# Patient Record
Sex: Male | Born: 1955 | Race: White | Hispanic: No | Marital: Married | Smoking: Former smoker
Health system: Southern US, Community
[De-identification: ages and names within clinical notes are randomized; demographics above are authoritative.]

## PROBLEM LIST (undated history)

## (undated) DIAGNOSIS — M1990 Unspecified osteoarthritis, unspecified site: Secondary | ICD-10-CM

## (undated) DIAGNOSIS — E669 Obesity, unspecified: Secondary | ICD-10-CM

## (undated) DIAGNOSIS — J45909 Unspecified asthma, uncomplicated: Secondary | ICD-10-CM

## (undated) DIAGNOSIS — K219 Gastro-esophageal reflux disease without esophagitis: Secondary | ICD-10-CM

## (undated) DIAGNOSIS — R7303 Prediabetes: Secondary | ICD-10-CM

## (undated) DIAGNOSIS — M109 Gout, unspecified: Secondary | ICD-10-CM

## (undated) DIAGNOSIS — I700 Atherosclerosis of aorta: Secondary | ICD-10-CM

## (undated) DIAGNOSIS — R0600 Dyspnea, unspecified: Secondary | ICD-10-CM

## (undated) DIAGNOSIS — R202 Paresthesia of skin: Secondary | ICD-10-CM

## (undated) HISTORY — DX: Atherosclerosis of aorta: I70.0

## (undated) HISTORY — DX: Gout, unspecified: M10.9

## (undated) HISTORY — DX: Prediabetes: R73.03

## (undated) HISTORY — DX: Unspecified osteoarthritis, unspecified site: M19.90

## (undated) HISTORY — DX: Obesity, unspecified: E66.9

## (undated) HISTORY — DX: Paresthesia of skin: R20.2

## (undated) HISTORY — PX: OTHER SURGICAL HISTORY: SHX169

---

## 2017-02-20 NOTE — H&P (Signed)
Alphonsa OverallBrad Brookelyn Gaynor PA-C, MPAS [facility] now [facility]  [place] [address], [place], [place] [address] Phone: [phone] [email or web address] Facebook  Instagram  LinkedIn  Twitter     Travis SmokerRandy Simmons is an 62 y.o. male.    Chief Complaint: left shoulder pain  HPI: Pt is a 62 y.o. male complaining of left shoulder pain for multiple years. Pain had continually increased since the beginning. X-rays in the clinic show end-stage arthritic changes of the left shoulder. Pt has tried various conservative treatments which have failed to alleviate their symptoms, including injections and therapy. Various options are discussed with the patient. Risks, benefits and expectations were discussed with the patient. Patient understand the risks, benefits and expectations and wishes to proceed with surgery.   PCP:  No primary care provider on file.  D/C Plans: Home  PMH: No past medical history on file.   Social History:  has no tobacco, alcohol, and drug history on file.  Allergies:  Allergies  Allergen Reactions  . Naproxen Rash    Medications: No current facility-administered medications for this encounter.    Current Outpatient Medications  Medication Sig Dispense Refill  . albuterol (PROVENTIL HFA;VENTOLIN HFA) 108 (90 Base) MCG/ACT inhaler Inhale 2 puffs into the lungs every 4 (four) hours as needed for wheezing or shortness of breath.    . budesonide-formoterol (SYMBICORT) 160-4.5 MCG/ACT inhaler Inhale 2 puffs into the lungs 2 (two) times daily.    Marland Kitchen. ibuprofen (ADVIL,MOTRIN) 200 MG tablet Take 600 mg by mouth 2 (two) times daily as needed for headache or mild pain.      No results found for this or any previous visit (from the past 48 hour(s)). No results found.  ROS: Pain with rom of the left upper extremity  Physical Exam:  Alert and oriented 62 y.o. male in no acute distress Cranial nerves 2-12 intact Cervical spine: full rom with no  tenderness, nv intact distally Chest: active breath sounds bilaterally, no wheeze rhonchi or rales Heart: regular rate and rhythm, no murmur Abd: non tender non distended with active bowel sounds Hip is stable with rom  Left shoulder: limited rom due to crepitus Strength of ER and IR 4.5/5  No rashes or edema  Assessment/Plan Assessment: left shoulder end stage osteoarthritis  Plan: Patient will undergo a left total shoulder arthro[facility]. Risks benefits and expectations were discussed with the patient. Patient understand risks, benefits and expectations and wishes to proceed.

## 2017-02-25 NOTE — Pre-Procedure Instructions (Signed)
Susy FrizzleRandy Joe Teutsch  02/25/2017      [id number] - [place], [place] - [address] [place] [id number] Phone: [phone] Fax: [phone]    Your procedure is scheduled on Friday, March 01, 2017  [facility] "A" at 11:15AM   Call this number if you have problems the morning of surgery:  [phone]   Remember:  Do not eat food or drink liquids after midnight.  Take these medicines the morning of surgery with A SIP OF WATER: Budesonide-formoterol (SYMBICORT). If needed Albuterol Inhaler for cough or wheezing(Bring with you the day of surgery).  As of today, stop taking all Aspirins, Vitamins, Fish oils, and Herbal medications. Also stop all NSAIDS i.e. Advil, Ibuprofen, Motrin, Aleve, Anaprox, Naproxen, BC and Goody Powders.   Do not wear jewelry.  Do not wear lotions, powders, colognes, or deodorant.  Do not shave 48 hours prior to surgery.  Men may shave face and neck.  Do not bring valuables to the hospital.  [facility] is not responsible for any belongings or valuables.  Contacts, dentures or bridgework may not be worn into surgery.  Leave your suitcase in the car.  After surgery it may be brought to your room.  For patients admitted to the hospital, discharge time will be determined by your treatment team.  Patients discharged the day of surgery will not be allowed to drive home.   Special instructions:  [place]- Preparing For Surgery  Before surgery, you can play an important role. Because skin is not sterile, your skin needs to be as free of germs as possible. You can reduce the number of germs on your skin by washing with CHG (chlorahexidine gluconate) Soap before surgery.  CHG is an antiseptic cleaner which kills germs and bonds with the skin to continue killing germs even after washing.  Please do not use if you have an allergy to CHG or antibacterial soaps. If your skin  becomes reddened/irritated stop using the CHG.  Do not shave (including legs and underarms) for at least 48 hours prior to first CHG shower. It is OK to shave your face.  Please follow these instructions carefully.   1. Shower the NIGHT BEFORE SURGERY and the MORNING OF SURGERY with CHG.   2. If you chose to wash your hair, wash your hair first as usual with your normal shampoo.  3. After you shampoo, rinse your hair and body thoroughly to remove the shampoo.  4. Use CHG as you would any other liquid soap. You can apply CHG directly to the skin and wash gently with a scrungie or a clean washcloth.   5. Apply the CHG Soap to your body ONLY FROM THE NECK DOWN.  Do not use on open wounds or open sores. Avoid contact with your eyes, ears, mouth and genitals (private parts). Wash Face and genitals (private parts)  with your normal soap.  6. Wash thoroughly, paying special attention to the area where your surgery will be performed.  7. Thoroughly rinse your body with warm water from the neck down.  8. DO NOT shower/wash with your normal soap after using and rinsing off the CHG Soap.  9. Pat yourself dry with a CLEAN TOWEL.  10. Wear CLEAN PAJAMAS to bed the night before surgery, wear comfortable clothes the morning of surgery  11. Place CLEAN SHEETS on your bed the night of your first shower  and DO NOT SLEEP WITH PETS.  Day of Surgery: Do not apply any deodorants/lotions. Please wear clean clothes to the hospital/surgery center.    Please read over the following fact sheets that you were given. Pain Booklet, Coughing and Deep Breathing, Total Joint Packet, MRSA Information and Surgical Site Infection Prevention

## 2017-02-26 ENCOUNTER — Other Ambulatory Visit: Payer: Self-pay

## 2017-02-26 ENCOUNTER — Encounter (HOSPITAL_COMMUNITY): Payer: Self-pay

## 2017-02-26 ENCOUNTER — Encounter (HOSPITAL_COMMUNITY)
Admission: RE | Admit: 2017-02-26 | Discharge: 2017-02-26 | Disposition: A | Discharge status: Home / Self Care | Payer: No Typology Code available for payment source | Source: Ambulatory Visit | Attending: Orthopedic Surgery | Admitting: Orthopedic Surgery

## 2017-02-26 HISTORY — DX: Dyspnea, unspecified: R06.00

## 2017-02-26 HISTORY — DX: Unspecified asthma, uncomplicated: J45.909

## 2017-02-26 HISTORY — DX: Unspecified osteoarthritis, unspecified site: M19.90

## 2017-02-26 LAB — CBC
HEMATOCRIT: 41.7 % (ref 39.0–52.0)
HEMOGLOBIN: 14.4 g/dL (ref 13.0–17.0)
MCH: 32.7 pg (ref 26.0–34.0)
MCHC: 34.5 g/dL (ref 30.0–36.0)
MCV: 94.6 fL (ref 78.0–100.0)
Platelets: 185 10*3/uL (ref 150–400)
RBC: 4.41 MIL/uL (ref 4.22–5.81)
RDW: 13.1 % (ref 11.5–15.5)
WBC: 5 10*3/uL (ref 4.0–10.5)

## 2017-02-26 LAB — SURGICAL PCR SCREEN
MRSA, PCR: NEGATIVE
STAPHYLOCOCCUS AUREUS: NEGATIVE

## 2017-02-28 NOTE — Anesthesia Preprocedure Evaluation (Addendum)
Anesthesia Evaluation  Patient identified by MRN, date of birth, ID band Patient awake    Reviewed: Allergy & Precautions, NPO status , Patient's Chart, lab work & pertinent test results  Airway Mallampati: II  TM Distance: >3 FB Neck ROM: Full    Dental no notable dental hx. (+) Edentulous Upper, Edentulous Lower   Pulmonary neg pulmonary ROS, former smoker,    Pulmonary exam normal breath sounds clear to auscultation       Cardiovascular negative cardio ROS Normal cardiovascular exam Rhythm:Regular Rate:Normal     Neuro/Psych negative neurological ROS  negative psych ROS   GI/Hepatic negative GI ROS, Neg liver ROS,   Endo/Other  negative endocrine ROS  Renal/GU negative Renal ROS  negative genitourinary   Musculoskeletal negative musculoskeletal ROS (+)   Abdominal   Peds  Hematology negative hematology ROS (+)   Anesthesia Other Findings   Reproductive/Obstetrics negative OB ROS                             Lab Results  Component Value Date   WBC 5.0 02/26/2017   HGB 14.4 02/26/2017   HCT 41.7 02/26/2017   MCV 94.6 02/26/2017   PLT 185 02/26/2017  No results found for: CREATININE, BUN, NA, K, CL, CO2   Anesthesia Physical Anesthesia Plan  ASA: II  Anesthesia Plan: Regional and General   Post-op Pain Management: GA combined w/ Regional for post-op pain   Induction: Intravenous  PONV Risk Score and Plan: 2 and Treatment may vary due to age or medical condition, Ondansetron, Midazolam and Dexamethasone  Airway Management Planned: Oral ETT  Additional Equipment:   Intra-op Plan:   Post-operative Plan: Extubation in OR  Informed Consent: I have reviewed the patients History and Physical, chart, labs and discussed the procedure including the risks, benefits and alternatives for the proposed anesthesia with the patient or authorized representative who has indicated his/her  understanding and acceptance.   Dental advisory given  Plan Discussed with: CRNA and Anesthesiologist  Anesthesia Plan Comments:         Anesthesia Quick Evaluation

## 2017-03-01 ENCOUNTER — Encounter (HOSPITAL_COMMUNITY)
Admission: RE | Disposition: A | Discharge status: Home / Self Care | Payer: Self-pay | Source: Ambulatory Visit | Attending: Orthopedic Surgery

## 2017-03-01 ENCOUNTER — Inpatient Hospital Stay (HOSPITAL_COMMUNITY): Payer: No Typology Code available for payment source | Admitting: Anesthesiology

## 2017-03-01 ENCOUNTER — Encounter (HOSPITAL_COMMUNITY): Payer: Self-pay

## 2017-03-01 ENCOUNTER — Other Ambulatory Visit: Payer: Self-pay

## 2017-03-01 ENCOUNTER — Inpatient Hospital Stay (HOSPITAL_COMMUNITY): Payer: No Typology Code available for payment source

## 2017-03-01 ENCOUNTER — Inpatient Hospital Stay (HOSPITAL_COMMUNITY)
Admission: RE | Admit: 2017-03-01 | Discharge: 2017-03-02 | DRG: 483 | Disposition: A | Discharge status: Home / Self Care | Payer: No Typology Code available for payment source | Source: Ambulatory Visit | Attending: Orthopedic Surgery | Admitting: Orthopedic Surgery

## 2017-03-01 DIAGNOSIS — M25712 Osteophyte, left shoulder: Secondary | ICD-10-CM | POA: Diagnosis present

## 2017-03-01 DIAGNOSIS — Z886 Allergy status to analgesic agent status: Secondary | ICD-10-CM | POA: Diagnosis not present

## 2017-03-01 DIAGNOSIS — M25512 Pain in left shoulder: Secondary | ICD-10-CM | POA: Diagnosis present

## 2017-03-01 DIAGNOSIS — Z79899 Other long term (current) drug therapy: Secondary | ICD-10-CM

## 2017-03-01 DIAGNOSIS — M19012 Primary osteoarthritis, left shoulder: Secondary | ICD-10-CM | POA: Diagnosis present

## 2017-03-01 DIAGNOSIS — Z87891 Personal history of nicotine dependence: Secondary | ICD-10-CM

## 2017-03-01 DIAGNOSIS — Z7951 Long term (current) use of inhaled steroids: Secondary | ICD-10-CM

## 2017-03-01 DIAGNOSIS — Z96612 Presence of left artificial shoulder joint: Secondary | ICD-10-CM

## 2017-03-01 HISTORY — PX: TOTAL SHOULDER ARTHROPLASTY: SHX126

## 2017-03-01 SURGERY — ARTHROPLASTY, SHOULDER, TOTAL
Anesthesia: Regional | Site: Shoulder | Laterality: Left

## 2017-03-01 MED ORDER — MEPERIDINE HCL 25 MG/ML IJ SOLN: 6.2500 mg | INTRAMUSCULAR | Status: DC | PRN

## 2017-03-01 MED ORDER — MENTHOL 3 MG MT LOZG: 1.0000 | LOZENGE | OROMUCOSAL | Status: DC | PRN

## 2017-03-01 MED ORDER — DEXAMETHASONE SODIUM PHOSPHATE 10 MG/ML IJ SOLN
INTRAMUSCULAR | Status: DC | PRN
  Administered 2017-03-01: 5 mg via INTRAVENOUS

## 2017-03-01 MED ORDER — PROPOFOL 10 MG/ML IV BOLUS
INTRAVENOUS | Status: DC | PRN
  Administered 2017-03-01: 200 mg via INTRAVENOUS

## 2017-03-01 MED ORDER — GABAPENTIN 300 MG PO CAPS
ORAL_CAPSULE | ORAL | Status: AC
  Filled 2017-03-01: qty 1

## 2017-03-01 MED ORDER — GELATIN ABSORBABLE 12-7 MM EX MISC
CUTANEOUS | Status: DC | PRN
  Administered 2017-03-01: 1

## 2017-03-01 MED ORDER — OXYCODONE HCL 5 MG PO TABS: 5.0000 mg | ORAL_TABLET | ORAL | Status: DC | PRN

## 2017-03-01 MED ORDER — ACETAMINOPHEN 10 MG/ML IV SOLN
1000.0000 mg | Freq: Once | INTRAVENOUS | Status: DC | PRN
Start: 2017-03-01 — End: 2017-03-01

## 2017-03-01 MED ORDER — DEXTROSE 5 % IV SOLN
INTRAVENOUS | Status: DC | PRN
  Administered 2017-03-01: 60 ug/min via INTRAVENOUS

## 2017-03-01 MED ORDER — ROCURONIUM BROMIDE 10 MG/ML (PF) SYRINGE
PREFILLED_SYRINGE | INTRAVENOUS | Status: DC | PRN
  Administered 2017-03-01: 50 mg via INTRAVENOUS
  Administered 2017-03-01: 20 mg via INTRAVENOUS
  Administered 2017-03-01: 10 mg via INTRAVENOUS

## 2017-03-01 MED ORDER — METHOCARBAMOL 1000 MG/10ML IJ SOLN
500.0000 mg | Freq: Four times a day (QID) | INTRAVENOUS | Status: DC | PRN
  Filled 2017-03-01: qty 5

## 2017-03-01 MED ORDER — THROMBIN (RECOMBINANT) 5000 UNITS EX SOLR
CUTANEOUS | Status: AC
  Filled 2017-03-01: qty 5000

## 2017-03-01 MED ORDER — OXYCODONE HCL 5 MG PO TABS
10.0000 mg | ORAL_TABLET | ORAL | Status: DC | PRN
  Administered 2017-03-02 (×2): 10 mg via ORAL
  Filled 2017-03-01 (×2): qty 2

## 2017-03-01 MED ORDER — OXYCODONE HCL 5 MG PO TABS: 5.0000 mg | ORAL_TABLET | ORAL | 0 refills | Status: DC | PRN

## 2017-03-01 MED ORDER — METHOCARBAMOL 500 MG PO TABS
500.0000 mg | ORAL_TABLET | Freq: Four times a day (QID) | ORAL | Status: DC | PRN
  Administered 2017-03-02 (×2): 500 mg via ORAL
  Filled 2017-03-01 (×2): qty 1

## 2017-03-01 MED ORDER — MORPHINE SULFATE (PF) 4 MG/ML IV SOLN: 4.0000 mg | INTRAVENOUS | Status: DC | PRN

## 2017-03-01 MED ORDER — DEXAMETHASONE SODIUM PHOSPHATE 10 MG/ML IJ SOLN
INTRAMUSCULAR | Status: AC
  Filled 2017-03-01: qty 1

## 2017-03-01 MED ORDER — ONDANSETRON HCL 4 MG/2ML IJ SOLN
INTRAMUSCULAR | Status: AC
  Filled 2017-03-01: qty 2

## 2017-03-01 MED ORDER — DOCUSATE SODIUM 100 MG PO CAPS
100.0000 mg | ORAL_CAPSULE | Freq: Two times a day (BID) | ORAL | Status: DC
  Administered 2017-03-01 – 2017-03-02 (×2): 100 mg via ORAL
  Filled 2017-03-01 (×2): qty 1

## 2017-03-01 MED ORDER — MIDAZOLAM HCL 5 MG/5ML IJ SOLN
INTRAMUSCULAR | Status: DC | PRN
  Administered 2017-03-01: 2 mg via INTRAVENOUS

## 2017-03-01 MED ORDER — CEFAZOLIN SODIUM-DEXTROSE 2-4 GM/100ML-% IV SOLN
2.0000 g | INTRAVENOUS | Status: AC
  Administered 2017-03-01: 2 g via INTRAVENOUS

## 2017-03-01 MED ORDER — LACTATED RINGERS IV SOLN
INTRAVENOUS | Status: DC | PRN
  Administered 2017-03-01 (×2): via INTRAVENOUS

## 2017-03-01 MED ORDER — ROCURONIUM BROMIDE 10 MG/ML (PF) SYRINGE
PREFILLED_SYRINGE | INTRAVENOUS | Status: AC
  Filled 2017-03-01: qty 5

## 2017-03-01 MED ORDER — ACETAMINOPHEN 325 MG PO TABS: 650.0000 mg | ORAL_TABLET | Freq: Three times a day (TID) | ORAL | Status: DC | PRN

## 2017-03-01 MED ORDER — SUGAMMADEX SODIUM 200 MG/2ML IV SOLN
INTRAVENOUS | Status: AC
  Filled 2017-03-01: qty 2

## 2017-03-01 MED ORDER — EPHEDRINE 5 MG/ML INJ
INTRAVENOUS | Status: AC
  Filled 2017-03-01: qty 10

## 2017-03-01 MED ORDER — PROMETHAZINE HCL 25 MG/ML IJ SOLN: 6.2500 mg | INTRAMUSCULAR | Status: DC | PRN

## 2017-03-01 MED ORDER — CLONIDINE HCL 0.2 MG PO TABS
0.2000 mg | ORAL_TABLET | Freq: Once | ORAL | Status: AC
  Administered 2017-03-01: 0.2 mg via ORAL
  Filled 2017-03-01: qty 1

## 2017-03-01 MED ORDER — SODIUM CHLORIDE 0.9 % IV SOLN: INTRAVENOUS | Status: DC

## 2017-03-01 MED ORDER — METHOCARBAMOL 500 MG PO TABS: 500.0000 mg | ORAL_TABLET | Freq: Three times a day (TID) | ORAL | 1 refills | Status: DC | PRN

## 2017-03-01 MED ORDER — ACETAMINOPHEN 650 MG RE SUPP: 650.0000 mg | RECTAL | Status: DC | PRN

## 2017-03-01 MED ORDER — BUPIVACAINE-EPINEPHRINE (PF) 0.5% -1:200000 IJ SOLN
INTRAMUSCULAR | Status: DC | PRN
  Administered 2017-03-01: 10 mL via PERINEURAL

## 2017-03-01 MED ORDER — PROPOFOL 10 MG/ML IV BOLUS
INTRAVENOUS | Status: AC
  Filled 2017-03-01: qty 20

## 2017-03-01 MED ORDER — GABAPENTIN 300 MG PO CAPS
300.0000 mg | ORAL_CAPSULE | Freq: Once | ORAL | Status: AC
  Administered 2017-03-01: 300 mg via ORAL

## 2017-03-01 MED ORDER — THROMBIN (RECOMBINANT) 5000 UNITS EX SOLR
CUTANEOUS | Status: DC | PRN
  Administered 2017-03-01: 5000 [IU] via TOPICAL

## 2017-03-01 MED ORDER — ONDANSETRON HCL 4 MG PO TABS: 4.0000 mg | ORAL_TABLET | Freq: Four times a day (QID) | ORAL | Status: DC | PRN

## 2017-03-01 MED ORDER — ACETAMINOPHEN 500 MG PO TABS
1000.0000 mg | ORAL_TABLET | Freq: Once | ORAL | Status: AC
  Administered 2017-03-01: 1000 mg via ORAL

## 2017-03-01 MED ORDER — HYDROCODONE-ACETAMINOPHEN 7.5-325 MG PO TABS: 1.0000 | ORAL_TABLET | Freq: Once | ORAL | Status: DC | PRN

## 2017-03-01 MED ORDER — SODIUM CHLORIDE 0.9 % IR SOLN
Status: DC | PRN
  Administered 2017-03-01 (×2): 1000 mL

## 2017-03-01 MED ORDER — ACETAMINOPHEN 500 MG PO TABS
ORAL_TABLET | ORAL | Status: AC
  Filled 2017-03-01: qty 2

## 2017-03-01 MED ORDER — POLYETHYLENE GLYCOL 3350 17 G PO PACK: 17.0000 g | PACK | Freq: Every day | ORAL | Status: DC | PRN

## 2017-03-01 MED ORDER — ONDANSETRON HCL 4 MG/2ML IJ SOLN: 4.0000 mg | Freq: Four times a day (QID) | INTRAMUSCULAR | Status: DC | PRN

## 2017-03-01 MED ORDER — SUGAMMADEX SODIUM 200 MG/2ML IV SOLN
INTRAVENOUS | Status: DC | PRN
  Administered 2017-03-01: 200 mg via INTRAVENOUS

## 2017-03-01 MED ORDER — MIDAZOLAM HCL 2 MG/2ML IJ SOLN
INTRAMUSCULAR | Status: AC
  Filled 2017-03-01: qty 2

## 2017-03-01 MED ORDER — FENTANYL CITRATE (PF) 250 MCG/5ML IJ SOLN
INTRAMUSCULAR | Status: DC | PRN
  Administered 2017-03-01 (×2): 50 ug via INTRAVENOUS

## 2017-03-01 MED ORDER — LIDOCAINE 2% (20 MG/ML) 5 ML SYRINGE
INTRAMUSCULAR | Status: DC | PRN
  Administered 2017-03-01: 80 mg via INTRAVENOUS

## 2017-03-01 MED ORDER — ALBUTEROL SULFATE (2.5 MG/3ML) 0.083% IN NEBU: 3.0000 mL | INHALATION_SOLUTION | RESPIRATORY_TRACT | Status: DC | PRN

## 2017-03-01 MED ORDER — EPHEDRINE SULFATE-NACL 50-0.9 MG/10ML-% IV SOSY
PREFILLED_SYRINGE | INTRAVENOUS | Status: DC | PRN
Start: 2017-03-01 — End: 2017-03-01
  Administered 2017-03-01: 5 mg via INTRAVENOUS

## 2017-03-01 MED ORDER — CEFAZOLIN SODIUM-DEXTROSE 2-4 GM/100ML-% IV SOLN
2.0000 g | Freq: Four times a day (QID) | INTRAVENOUS | Status: AC
  Administered 2017-03-01 – 2017-03-02 (×3): 2 g via INTRAVENOUS
  Filled 2017-03-01 (×3): qty 100

## 2017-03-01 MED ORDER — GLYCOPYRROLATE 0.2 MG/ML IJ SOLN
INTRAMUSCULAR | Status: DC | PRN
  Administered 2017-03-01: 0.2 mg via INTRAVENOUS

## 2017-03-01 MED ORDER — PHENOL 1.4 % MT LIQD: 1.0000 | OROMUCOSAL | Status: DC | PRN

## 2017-03-01 MED ORDER — CEFAZOLIN SODIUM-DEXTROSE 2-4 GM/100ML-% IV SOLN
INTRAVENOUS | Status: AC
  Filled 2017-03-01: qty 100

## 2017-03-01 MED ORDER — ACETAMINOPHEN 325 MG PO TABS: 650.0000 mg | ORAL_TABLET | ORAL | Status: DC | PRN

## 2017-03-01 MED ORDER — MOMETASONE FURO-FORMOTEROL FUM 200-5 MCG/ACT IN AERO
2.0000 | INHALATION_SPRAY | Freq: Two times a day (BID) | RESPIRATORY_TRACT | Status: DC
  Filled 2017-03-01: qty 8.8

## 2017-03-01 MED ORDER — METOCLOPRAMIDE HCL 5 MG/ML IJ SOLN: 5.0000 mg | Freq: Three times a day (TID) | INTRAMUSCULAR | Status: DC | PRN

## 2017-03-01 MED ORDER — CHLORHEXIDINE GLUCONATE 4 % EX LIQD: 60.0000 mL | Freq: Once | CUTANEOUS | Status: DC

## 2017-03-01 MED ORDER — BISACODYL 10 MG RE SUPP: 10.0000 mg | Freq: Every day | RECTAL | Status: DC | PRN

## 2017-03-01 MED ORDER — METOCLOPRAMIDE HCL 5 MG PO TABS: 5.0000 mg | ORAL_TABLET | Freq: Three times a day (TID) | ORAL | Status: DC | PRN

## 2017-03-01 MED ORDER — HYDROMORPHONE HCL 1 MG/ML IJ SOLN: 0.2500 mg | INTRAMUSCULAR | Status: DC | PRN

## 2017-03-01 MED ORDER — FENTANYL CITRATE (PF) 250 MCG/5ML IJ SOLN
INTRAMUSCULAR | Status: AC
  Filled 2017-03-01: qty 5

## 2017-03-01 MED ORDER — LIDOCAINE 2% (20 MG/ML) 5 ML SYRINGE
INTRAMUSCULAR | Status: AC
  Filled 2017-03-01: qty 5

## 2017-03-01 SURGICAL SUPPLY — 70 items
BIT DRILL 5/64X5 DISP (BIT) ×3 IMPLANT
BLADE SAW SAG 73X25 THK (BLADE) ×2
BLADE SAW SGTL 73X25 THK (BLADE) ×1 IMPLANT
BUR SURG 4X8 MED (BURR) IMPLANT
BURR SURG 4MMX8MM MEDIUM (BURR)
BURR SURG 4X8 MED (BURR)
CAPT SHLDR TOTAL 2 ×3 IMPLANT
CEMENT HV SMART SET (Cement) ×3 IMPLANT
CLOSURE WOUND 1/2 X4 (GAUZE/BANDAGES/DRESSINGS) ×1
COVER SURGICAL LIGHT HANDLE (MISCELLANEOUS) ×3 IMPLANT
DRAPE IMP U-DRAPE 54X76 (DRAPES) ×3 IMPLANT
DRAPE INCISE IOBAN 66X45 STRL (DRAPES) ×6 IMPLANT
DRAPE ORTHO SPLIT 77X108 STRL (DRAPES) ×4
DRAPE SURG ORHT 6 SPLT 77X108 (DRAPES) ×2 IMPLANT
DRAPE U-SHAPE 47X51 STRL (DRAPES) ×3 IMPLANT
DRSG ADAPTIC 3X8 NADH LF (GAUZE/BANDAGES/DRESSINGS) ×3 IMPLANT
DRSG EMULSION OIL 3X3 NADH (GAUZE/BANDAGES/DRESSINGS) ×3 IMPLANT
DRSG PAD ABDOMINAL 8X10 ST (GAUZE/BANDAGES/DRESSINGS) ×6 IMPLANT
DURAPREP 26ML APPLICATOR (WOUND CARE) ×3 IMPLANT
ELECT BLADE 4.0 EZ CLEAN MEGAD (MISCELLANEOUS) ×3
ELECT NEEDLE TIP 2.8 STRL (NEEDLE) ×3 IMPLANT
ELECT REM PT RETURN 9FT ADLT (ELECTROSURGICAL) ×3
ELECTRODE BLDE 4.0 EZ CLN MEGD (MISCELLANEOUS) ×1 IMPLANT
ELECTRODE REM PT RTRN 9FT ADLT (ELECTROSURGICAL) ×1 IMPLANT
GAUZE SPONGE 4X4 12PLY STRL (GAUZE/BANDAGES/DRESSINGS) ×3 IMPLANT
GLOVE BIOGEL PI ORTHO PRO 7.5 (GLOVE) ×2
GLOVE BIOGEL PI ORTHO PRO SZ8 (GLOVE) ×2
GLOVE ORTHO TXT STRL SZ7.5 (GLOVE) ×3 IMPLANT
GLOVE PI ORTHO PRO STRL 7.5 (GLOVE) ×1 IMPLANT
GLOVE PI ORTHO PRO STRL SZ8 (GLOVE) ×1 IMPLANT
GLOVE SURG ORTHO 8.5 STRL (GLOVE) ×6 IMPLANT
GOWN STRL REUS W/ TWL XL LVL3 (GOWN DISPOSABLE) ×3 IMPLANT
GOWN STRL REUS W/TWL XL LVL3 (GOWN DISPOSABLE) ×6
KIT BASIN OR (CUSTOM PROCEDURE TRAY) ×3 IMPLANT
KIT ROOM TURNOVER OR (KITS) ×3 IMPLANT
MANIFOLD NEPTUNE II (INSTRUMENTS) ×3 IMPLANT
NDL SUT 6 .5 CRC .975X.05 MAYO (NEEDLE) ×1 IMPLANT
NEEDLE 1/2 CIR MAYO (NEEDLE) ×6 IMPLANT
NEEDLE HYPO 25GX1X1/2 BEV (NEEDLE) ×3 IMPLANT
NEEDLE MAYO TAPER (NEEDLE) ×2
NS IRRIG 1000ML POUR BTL (IV SOLUTION) ×6 IMPLANT
PACK SHOULDER (CUSTOM PROCEDURE TRAY) ×3 IMPLANT
PAD ARMBOARD 7.5X6 YLW CONV (MISCELLANEOUS) ×6 IMPLANT
SLEEVE SCD COMPRESS KNEE MED (MISCELLANEOUS) ×3 IMPLANT
SLING ULTRA II LARGE (SOFTGOODS) ×3 IMPLANT
SMARTMIX MINI TOWER (MISCELLANEOUS) ×6
SPONGE DRAIN TRACH 4X4 STRL 2S (GAUZE/BANDAGES/DRESSINGS) ×3 IMPLANT
SPONGE LAP 18X18 X RAY DECT (DISPOSABLE) ×3 IMPLANT
SPONGE LAP 4X18 X RAY DECT (DISPOSABLE) ×3 IMPLANT
SPONGE SURGIFOAM ABS GEL SZ50 (HEMOSTASIS) IMPLANT
STRIP CLOSURE SKIN 1/2X4 (GAUZE/BANDAGES/DRESSINGS) ×2 IMPLANT
SUCTION FRAZIER HANDLE 10FR (MISCELLANEOUS) ×2
SUCTION TUBE FRAZIER 10FR DISP (MISCELLANEOUS) ×1 IMPLANT
SUT FIBERWIRE #2 38 T-5 BLUE (SUTURE) ×15
SUT MNCRL AB 4-0 PS2 18 (SUTURE) ×3 IMPLANT
SUT VIC AB 0 CT1 27 (SUTURE) ×2
SUT VIC AB 0 CT1 27XBRD ANBCTR (SUTURE) ×1 IMPLANT
SUT VIC AB 0 CT2 27 (SUTURE) ×3 IMPLANT
SUT VIC AB 2-0 CT1 27 (SUTURE) ×2
SUT VIC AB 2-0 CT1 TAPERPNT 27 (SUTURE) ×1 IMPLANT
SUT VICRYL AB 2 0 TIES (SUTURE) ×3 IMPLANT
SUTURE FIBERWR #2 38 T-5 BLUE (SUTURE) ×5 IMPLANT
SYR CONTROL 10ML LL (SYRINGE) ×3 IMPLANT
TOWEL OR 17X24 6PK STRL BLUE (TOWEL DISPOSABLE) ×3 IMPLANT
TOWEL OR 17X26 10 PK STRL BLUE (TOWEL DISPOSABLE) ×3 IMPLANT
TOWER SMARTMIX MINI (MISCELLANEOUS) ×2 IMPLANT
TRAY FOLEY W/METER SILVER 16FR (SET/KITS/TRAYS/PACK) ×3 IMPLANT
TUBE CONNECTING 12'X1/4 (SUCTIONS) ×1
TUBE CONNECTING 12X1/4 (SUCTIONS) ×2 IMPLANT
YANKAUER SUCT BULB TIP NO VENT (SUCTIONS) IMPLANT

## 2017-03-01 NOTE — Brief Op Note (Signed)
03/01/2017  10:58 AM  PATIENT:  Travis Simmons  62 y.o. male  PRE-OPERATIVE DIAGNOSIS:  Left shoulder osteoarthritis, end stage  POST-OPERATIVE DIAGNOSIS:  Left shoulder osteoarthritis, end stage  PROCEDURE:  Procedure(s): LEFT SHOULDER ANATOMIC TOTAL SHOULDER ARTHROPLASTY (Left) [employer]  SURGEON:  Surgeon(s) and Role:    Beverely Low* Arihant Pennings, MD - Primary  PHYSICIAN ASSISTANT:   ASSISTANTS: Thea Gisthomas B Dixon, PA-C   ANESTHESIA:   regional and general  EBL:  400 mL   BLOOD ADMINISTERED:none  DRAINS: none   LOCAL MEDICATIONS USED:  MARCAINE     SPECIMEN:  No Specimen  DISPOSITION OF SPECIMEN:  N/A  COUNTS:  YES  TOURNIQUET:  * No tourniquets in log *  DICTATION: .Other Dictation: Dictation Number [id number]  PLAN OF CARE: Admit to inpatient   PATIENT DISPOSITION:  PACU - hemodynamically stable.   Delay start of Pharmacological VTE agent (>24hrs) due to surgical blood loss or risk of bleeding: not applicable

## 2017-03-01 NOTE — Anesthesia Procedure Notes (Deleted)
Anesthesia Procedure Image    

## 2017-03-01 NOTE — Anesthesia Postprocedure Evaluation (Signed)
Anesthesia Post Note  Patient: Travis Simmons  Procedure(s) Performed: LEFT SHOULDER ANATOMIC TOTAL SHOULDER ARTHROPLASTY (Left Shoulder)     Patient location during evaluation: PACU Anesthesia Type: Regional Level of consciousness: awake and alert Pain management: pain level controlled Vital Signs Assessment: post-procedure vital signs reviewed and stable Respiratory status: spontaneous breathing, nonlabored ventilation, respiratory function stable and patient connected to nasal cannula oxygen Cardiovascular status: blood pressure returned to baseline and stable Postop Assessment: no apparent nausea or vomiting Anesthetic complications: no    Last Vitals:  Vitals:   03/01/17 1057 03/01/17 1112  BP: 108/73 115/66  Pulse: 63 60  Resp: 20 18  Temp:    SpO2: 96% 94%    Last Pain:  Vitals:   03/01/17 1057  TempSrc:   PainSc: 0-No pain                 Trevor IhaStephen A Sarah Baez

## 2017-03-01 NOTE — Interval H&P Note (Signed)
History and Physical Interval Note:  03/01/2017 7:30 AM  Travis Simmons  has presented today for surgery, with the diagnosis of Left shoulder osteoarthritis  The various methods of treatment have been discussed with the patient and family. After consideration of risks, benefits and other options for treatment, the patient has consented to  Procedure(s): LEFT SHOULDER ANATOMIC TOTAL SHOULDER ARTHROPLASTY (Left) as a surgical intervention .  The patient's history has been reviewed, patient examined, no change in status, stable for surgery.  I have reviewed the patient's chart and labs.  Questions were answered to the patient's satisfaction.     Jemaine Prokop,STEVEN R

## 2017-03-01 NOTE — Discharge Instructions (Signed)
Ice to the shoulder as much as you can.  Keep the sling on while up and walking around.  Ok to lean to the left side and slide the sling off, then slide a pillow under the arm to rest the shoulder while in the home and seated.  Do exercises throughout the day.    Lap slides Pendulums, Gentle assisted forward elevation (hand to face)  No pushing pulling or lifting.  Keep the incision clean and dry and covered for one week, then ok to get it wet in the shower.  Follow up in the office in two weeks, call [phone] for appt

## 2017-03-01 NOTE — Transfer of Care (Signed)
Immediate Anesthesia Transfer of Care Note  Patient: Travis Simmons  Procedure(s) Performed: LEFT SHOULDER ANATOMIC TOTAL SHOULDER ARTHROPLASTY (Left Shoulder)  Patient Location: PACU  Anesthesia Type:GA combined with regional for post-op pain  Level of Consciousness: drowsy and patient cooperative  Airway & Oxygen Therapy: Patient Spontanous Breathing and Patient connected to face mask oxygen  Post-op Assessment: Report given to RN and Post -op Vital signs reviewed and stable  Post vital signs: Reviewed and stable  Last Vitals:  Vitals:   03/01/17 0543 03/01/17 1042  BP: (!) 157/78 110/61  Pulse: (!) 59 62  Resp: 18 15  Temp: (!) 36.4 C 36.8 C  SpO2: 96% 100%    Last Pain:  Vitals:   03/01/17 0555  TempSrc:   PainSc: 7          Complications: No apparent anesthesia complications

## 2017-03-01 NOTE — Anesthesia Procedure Notes (Addendum)
Anesthesia Regional Block: Interscalene brachial plexus block   Pre-Anesthetic Checklist: ,, timeout performed, Correct Patient, Correct Site, Correct Laterality, Correct Procedure, Correct Position, site marked, Risks and benefits discussed, at surgeon's request and post-op pain management  Laterality: Upper and Left  Prep: chloraprep, alcohol swabs       Needles:  Injection technique: Single-shot  Needle Type: Echogenic Stimulator Needle     Needle Length: 9cm  Needle Gauge: 22     Additional Needles:   Procedures:,,,, ultrasound used (permanent image in chart),,,,  Narrative:  Start time: 03/01/2017 7:00 AM End time: 03/01/2017 7:12 AM  Performed by: Personally   Additional Notes: Block assessed prior to start of surgery

## 2017-03-01 NOTE — Anesthesia Procedure Notes (Addendum)
Procedure Name: Intubation Date/Time: 03/01/2017 7:47 AM Performed by: Freddie Breech, CRNA Pre-anesthesia Checklist: Patient identified, Emergency Drugs available, Suction available and Patient being monitored Patient Re-evaluated:Patient Re-evaluated prior to induction Oxygen Delivery Method: Circle System Utilized Preoxygenation: Pre-oxygenation with 100% oxygen Induction Type: IV induction Ventilation: Mask ventilation without difficulty and Oral airway inserted - appropriate to patient size Laryngoscope Size: Mac and 4 Grade View: Grade I Tube type: Oral Tube size: 7.5 mm Number of attempts: 1 Airway Equipment and Method: Stylet and Oral airway Placement Confirmation: ETT inserted through vocal cords under direct vision,  positive ETCO2 and breath sounds checked- equal and bilateral Secured at: 23 cm Tube secured with: Tape Dental Injury: Teeth and Oropharynx as per pre-operative assessment

## 2017-03-02 LAB — BASIC METABOLIC PANEL
Anion gap: 14 (ref 5–15)
BUN: 17 mg/dL (ref 6–20)
CHLORIDE: 99 mmol/L — AB (ref 101–111)
CO2: 25 mmol/L (ref 22–32)
CREATININE: 0.95 mg/dL (ref 0.61–1.24)
Calcium: 9.1 mg/dL (ref 8.9–10.3)
GFR calc Af Amer: >60 mL/min (ref >60)
GFR calc non Af Amer: >60 mL/min (ref >60)
GLUCOSE: 134 mg/dL — AB (ref 65–99)
POTASSIUM: 4.1 mmol/L (ref 3.5–5.1)
Sodium: 138 mmol/L (ref 135–145)

## 2017-03-02 LAB — HEMOGLOBIN AND HEMATOCRIT, BLOOD
HEMATOCRIT: 41.3 % (ref 39.0–52.0)
Hemoglobin: 14.2 g/dL (ref 13.0–17.0)

## 2017-03-02 NOTE — Progress Notes (Signed)
Removed IV, provided discharge education and instructions, all questions and concerns addressed, Pt not in distress, discharged home with belongings accompanied by wife.

## 2017-03-02 NOTE — Evaluation (Addendum)
Occupational Therapy Evaluation and Treatment Patient Details Name: Travis Simmons MRN: [id number] DOB: 05-18-1955 Today's Date: 03/02/2017    History of Present Illness Pt is a 62 y.o. male s/p L shoulder anatomic total shoulder arthroplasty secondary to increase in L shoulder pain. No pertinent PMH.    Clinical Impression   PTA, pt was independent with ADL and functional mobility. He currently requires overall min assist for grooming tasks, mod assist for UB ADL, and mod assist for LB ADL and his wife is able to provide all necessary assistance. Pt and wife educated concerning conservative protocol as detailed below as well as compensatory ADL strategies for dressing, bathing, and grooming tasks. Additionally educated pt and wife concerning HEP for lap slides and elbow/wrist/hand AROM per protocol. Pt is able to complete these with supervision.   OT returned for second visit for ADL retraining to ensure safe UB and LB dressing techniques and pt and wife demonstrate understanding of all topics. Pt is also able to verbalize all precautions and adhere to these with supervision. From OT perspective, pt is safe for D/C home with assistance from his wife. OT will continue to follow while admitted and note plan to D/C today.     Follow Up Recommendations  No OT follow up;Supervision/Assistance - 24 hour    Equipment Recommendations  None recommended by OT    Recommendations for Other Services       Precautions / Restrictions Precautions Precautions: Shoulder Type of Shoulder Precautions: Conservative protocol:  Shoulder Interventions: Shoulder sling/immobilizer;Off for dressing/bathing/exercises Precaution Booklet Issued: Yes (comment) Precaution Comments: Sling at all times except ADL/exercise; NWB L UE, AROM elbow/wrist/hand to tolerance, no active or passive ROM L shoulder, OK for gentle AAROM, lap slides, and gentle assisted ADLs per orders. Required Braces or Orthoses: Sling(L shoulder  sling) Restrictions Weight Bearing Restrictions: Yes LUE Weight Bearing: Non weight bearing      Mobility Bed Mobility               General bed mobility comments: Sitting at EOB on my arrival  Transfers Overall transfer level: Needs assistance Equipment used: None Transfers: Sit to/from Stand Sit to Stand: Supervision         General transfer comment: Supervision for safety throughout sessions when ambulating. Noted good stability.     Balance Overall balance assessment: No apparent balance deficits (not formally assessed)                                         ADL either performed or assessed with clinical judgement   ADL Overall ADL's : Needs assistance/impaired Eating/Feeding: Minimal assistance;Sitting   Grooming: Minimal assistance;Standing   Upper Body Bathing: Minimal assistance;Sitting   Lower Body Bathing: Sit to/from stand;Moderate assistance;With caregiver independent assisting   Upper Body Dressing : Sitting;Moderate assistance;With caregiver independent assisting   Lower Body Dressing: Sit to/from stand;Moderate assistance;With caregiver independent assisting   Toilet Transfer: Supervision/safety;Ambulation;Comfort height toilet   Toileting- Clothing Manipulation and Hygiene: Supervision/safety;Sit to/from stand   Tub/ Shower Transfer: Min guard;Ambulation   Functional mobility during ADLs: Supervision/safety General ADL Comments: Pt educated concerning safe ADL participation with daily ADL tasks including always dress L UE first, sling wear schedule, NWB L UE, avoid scented deoderant, method to wash under operative arm, and safe positioning for L shoulder during sleep and shower. Handout provided and reviewed with pt and wife.  Vision Baseline Vision/History: Wears glasses Wears Glasses: Reading only Patient Visual Report: No change from baseline Additional Comments: Did not have glasses on eval and unable to read  handout but he reports this is baseline. Wife able to review handout.      Perception     Praxis      Pertinent Vitals/Pain Pain Assessment: 0-10 Pain Score: 6  Pain Location: L shoulder Pain Descriptors / Indicators: Aching;Guarding;Operative site guarding;Sore Pain Intervention(s): Limited activity within patient's tolerance;Monitored during session;Repositioned     Hand Dominance     Extremity/Trunk Assessment Upper Extremity Assessment Upper Extremity Assessment: LUE deficits/detail LUE Deficits / Details: Post-operative pain and limited assessment due to conservative protocol. Sensation in tact this morning (pt reports improvement after nerve block has now worn out). Functional wrist and hand AROM.            Communication     Cognition Arousal/Alertness: Awake/alert Behavior During Therapy: WFL for tasks assessed/performed Overall Cognitive Status: Within Functional Limits for tasks assessed                                     General Comments       Exercises Exercises: Shoulder;Other exercises Shoulder Exercises Elbow Flexion: AAROM;Left;Seated;15 reps Elbow Extension: AAROM;Left;Seated;15 reps Wrist Flexion: AROM;Left;Seated;15 reps Wrist Extension: AROM;Left;Seated;15 reps Digit Composite Flexion: AROM;Left;10 reps;Seated Composite Extension: AROM;Left;10 reps;Seated Other Exercises Other Exercises: Educated on cervical AROM  Other Exercises: Elbow supination/pronation AROM L x15 repetitions Other Exercises: Educated pt on lap slide techniques in supported position for 15 repetitions   Shoulder Instructions Shoulder Instructions Donning/doffing shirt without moving shoulder: Moderate assistance;Caregiver independent with task Method for sponge bathing under operated UE: Minimal assistance;Caregiver independent with task Donning/doffing sling/immobilizer: Minimal assistance Correct positioning of sling/immobilizer: Minimal assistance ROM  for elbow, wrist and digits of operated UE: Supervision/safety;Caregiver independent with task Sling wearing schedule (on at all times/off for ADL's): Supervision/safety;Caregiver independent with task Proper positioning of operated UE when showering: Supervision/safety Positioning of UE while sleeping: Supervision/safety    Home Living Family/patient expects to be discharged to:: Private residence Living Arrangements: Spouse/significant other Available Help at Discharge: Family;Available 24 hours/day Type of Home: House Home Access: Ramped entrance     Home Layout: One level     Bathroom Shower/Tub: Tub/shower unit;Curtain   Firefighter: Standard     Home Equipment: Hand held shower head          Prior Functioning/Environment Level of Independence: Independent        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 Problem List: Decreased strength;Decreased activity tolerance;Decreased range of motion;Decreased safety awareness;Decreased knowledge of precautions;Pain;Impaired UE functional use      OT Treatment/Interventions: Self-care/ADL training;Therapeutic exercise;Energy conservation;DME and/or AE instruction;Therapeutic activities;Patient/family education;Balance training    OT Goals(Current goals can be found in the care plan section) Acute Rehab OT Goals Patient Stated Goal: go home today OT Goal Formulation: With patient/family Time For Goal Achievement: 03/16/17 Potential to Achieve Goals: Good ADL Goals Pt Will Perform Grooming: sitting;with set-up Pt Will Perform Upper Body Dressing: with min assist;with caregiver independent in assisting;sitting Pt Will Perform Lower Body Dressing: with min assist;with caregiver independent in assisting;sit to/from stand Pt/caregiver will Perform Home Exercise Program: Left upper extremity;With written HEP provided;With Supervision(lap slides; elbow/wrist/hand AROM per protocol)  OT Frequency: Min 2X/week   Barriers to  D/C:  Co-evaluation              AM-PAC PT "6 Clicks" Daily Activity     Outcome Measure Help from another person eating meals?: A Little Help from another person taking care of personal grooming?: A Little Help from another person toileting, which includes using toliet, bedpan, or urinal?: A Little Help from another person bathing (including washing, rinsing, drying)?: A Lot Help from another person to put on and taking off regular upper body clothing?: A Lot Help from another person to put on and taking off regular lower body clothing?: A Lot 6 Click Score: 15   End of Session Equipment Utilized During Treatment: Other (comment)(L shoulder sling) Nurse Communication: Mobility status(OT returning for second visit; OT complete after 2nd visit)  Activity Tolerance: Patient tolerated treatment well Patient left: in bed;with call bell/phone within reach;with family/visitor present(seated at EOB)  OT Visit Diagnosis: Pain Pain - Right/Left: Left Pain - part of body: Shoulder                Time: [id number]; [id number] OT Time Calculation (min): 23 min; 12 min Charges:  OT General Charges $OT Visit: 2 Visits OT Evaluation $OT Eval Moderate Complexity: 1 Mod OT Treatments $Self Care/Home Management : 23-37 mins $Therapeutic Exercise: 8-22 mins G-Codes:     Doristine Sectionharity A Leelynd Maldonado, MS OTR/L  Pager: [phone]   Jazon Jipson A Shanelle Clontz 03/02/2017, 10:18 AM

## 2017-03-02 NOTE — Progress Notes (Addendum)
Subjective: 1 Day Post-Op Procedure(s) (LRB): LEFT SHOULDER ANATOMIC TOTAL SHOULDER ARTHROPLASTY (Left) Patient reports pain as moderate.   Reports pain about the shoulder. Well controlled with pain meds, did not receive them between 0100-0800 but reports relief for about 6 hours. No numbness or tingling, block has worn off. No CP, SOB, fever, chills, N/V. No other c/o. Feels ready to go home today.  Objective: Vital signs in last 24 hours: Temp:  [97.1 F (36.2 C)-98.2 F (36.8 C)] 98 F (36.7 C) (01/19 0434) Pulse Rate:  [56-70] 60 (01/19 0434) Resp:  [15-20] 18 (01/19 0434) BP: (107-137)/(61-80) 126/73 (01/19 0434) SpO2:  [93 %-100 %] 96 % (01/19 0434)  Intake/Output from previous day: 01/18 0701 - 01/19 0700 In: 1640 [P.O.:240; I.V.:1400] Out: 850 [Urine:450; Blood:400] Intake/Output this shift: No intake/output data recorded.  Recent Labs    03/02/17 0610  HGB 14.2   Recent Labs    03/02/17 0610  HCT 41.3   Recent Labs    03/02/17 0610  NA 138  K 4.1  CL 99*  CO2 25  BUN 17  CREATININE 0.95  GLUCOSE 134*  CALCIUM 9.1   No results for input(s): LABPT, INR in the last 72 hours.  Neurologically intact ABD soft Neurovascular intact Sensation intact distally Intact pulses distally Dorsiflexion/Plantar flexion intact Incision: dressing C/D/I and no drainage No cellulitis present Compartment soft no sign of DVT  Assessment/Plan: 1 Day Post-Op Procedure(s) (LRB): LEFT SHOULDER ANATOMIC TOTAL SHOULDER ARTHROPLASTY (Left) Advance diet Up with therapy D/C IV fluids  PT, D/C home today after PT as long as pain remains controlled Discussed D/C instructions Pt has aquacel dressing to place at home tomorrow per Dr. Ranell PatrickNorris Discussed with Dr. Ranell PatrickNorris who was here to see the patient  Dorothy SparkBISSELL, JACLYN M. 03/02/2017, 8:12 AM

## 2017-03-02 NOTE — Discharge Summary (Signed)
Patient ID: Travis Simmons MRN: [id number] DOB/AGE: [birth year] 62 y.o.  Admit date: 03/01/2017 Discharge date: 03/02/2017  Admission Diagnoses:  Active Problems:   S/P shoulder replacement, left   Discharge Diagnoses:  Same  Past Medical History:  Diagnosis Date  . Arthritis   . Asthma   . Dyspnea    with flare up    Surgeries: Procedure(s): LEFT SHOULDER ANATOMIC TOTAL SHOULDER ARTHROPLASTY on 03/01/2017   Consultants:   Discharged Condition: Improved  Hospital Course: Travis Simmons is an 62 y.o. male who was admitted 03/01/2017 for operative treatment of<principal problem not specified>. Patient has severe unremitting pain that affects sleep, daily activities, and work/hobbies. After pre-op clearance the patient was taken to the operating room on 03/01/2017 and underwent  Procedure(s): LEFT SHOULDER ANATOMIC TOTAL SHOULDER ARTHROPLASTY.    Patient was given perioperative antibiotics:  Anti-infectives (From admission, onward)   Start     Dose/Rate Route Frequency Ordered Stop   03/01/17 1330  ceFAZolin (ANCEF) IVPB 2g/100 mL premix     2 g 200 mL/hr over 30 Minutes Intravenous Every 6 hours 03/01/17 1323 03/02/17 0147   03/01/17 0550  ceFAZolin (ANCEF) IVPB 2g/100 mL premix     2 g 200 mL/hr over 30 Minutes Intravenous On call to O.R. 03/01/17 0550 03/01/17 0755       Patient was given sequential compression devices, early ambulation, and chemoprophylaxis to prevent DVT.  Patient benefited maximally from hospital stay and there were no complications.    Recent vital signs:  Patient Vitals for the past 24 hrs:  BP Temp Temp src Pulse Resp SpO2  03/02/17 0434 126/73 98 F (36.7 C) Oral 60 18 96 %  03/01/17 2340 133/75 97.8 F (36.6 C) Oral 64 18 93 %  03/01/17 2013 137/80 98.1 F (36.7 C) Oral 70 18 96 %  03/01/17 1300 108/74 (!) 97.1 F (36.2 C) Oral 68 18 95 %  03/01/17 1252 - (!) 97.2 F (36.2 C) - 60 16 96 %  03/01/17 1242 107/69 - - (!) 57 18 96 %   03/01/17 1227 112/66 - - (!) 56 19 95 %  03/01/17 1212 117/70 - - 65 19 94 %  03/01/17 1112 115/66 - - 60 18 94 %  03/01/17 1057 108/73 - - 63 20 96 %  03/01/17 1042 110/61 98.2 F (36.8 C) - 62 15 100 %     Recent laboratory studies:  Recent Labs    03/02/17 0610  HGB 14.2  HCT 41.3  NA 138  K 4.1  CL 99*  CO2 25  BUN 17  CREATININE 0.95  GLUCOSE 134*  CALCIUM 9.1     Discharge Medications:   Allergies as of 03/02/2017      Reactions   Naproxen Rash      Medication List    TAKE these medications   acetaminophen 500 MG tablet Commonly known as:  TYLENOL Take 1,000 mg by mouth every 8 (eight) hours as needed.   albuterol 108 (90 Base) MCG/ACT inhaler Commonly known as:  PROVENTIL HFA;VENTOLIN HFA Inhale 2 puffs into the lungs every 4 (four) hours as needed for wheezing or shortness of breath.   budesonide-formoterol 160-4.5 MCG/ACT inhaler Commonly known as:  SYMBICORT Inhale 2 puffs into the lungs 2 (two) times daily.   ibuprofen 200 MG tablet Commonly known as:  ADVIL,MOTRIN Take 600 mg by mouth 2 (two) times daily as needed for headache or mild pain.   methocarbamol 500 MG tablet Commonly  known as:  ROBAXIN Take 1 tablet (500 mg total) by mouth 3 (three) times daily as needed.   oxyCODONE 5 MG immediate release tablet Commonly known as:  ROXICODONE Take 1 tablet (5 mg total) by mouth every 4 (four) hours as needed for moderate pain or severe pain.       Diagnostic Studies: Dg Shoulder Left Port  Result Date: 03/01/2017 CLINICAL DATA:  Status post left shoulder replacement today. EXAM: LEFT SHOULDER - 1 VIEW COMPARISON:  None. FINDINGS: Left shoulder arthroplasty is in place. The device is located. No fracture. Mild acromioclavicular degenerative disease is noted. There is some atelectasis that mild atelectasis in the left lung base is noted. IMPRESSION: Status post left shoulder replacement.  No acute finding. Electronically Signed   By: Drusilla Kannerhomas   Dalessio M.D.   On: 03/01/2017 11:48    Disposition: Final discharge disposition not confirmed  Discharge Instructions    Call MD / Call 911   Complete by:  As directed    If you experience chest pain or shortness of breath, CALL 911 and be transported to the hospital emergency room.  If you develope a fever above 101 F, pus (white drainage) or increased drainage or redness at the wound, or calf pain, call your surgeon's office.   Constipation Prevention   Complete by:  As directed    Drink plenty of fluids.  Prune juice may be helpful.  You may use a stool softener, such as Colace (over the counter) 100 mg twice a day.  Use MiraLax (over the counter) for constipation as needed.   Diet - low sodium heart healthy   Complete by:  As directed    Increase activity slowly as tolerated   Complete by:  As directed       Follow-up Information    Beverely LowNorris, Steve, MD. Call in 2 weeks.   Specialty:  Orthopedic Surgery Why:  [phone] Contact information: [address] [place] [address] [phone]            Signed: Dorothy SparkBISSELL, JACLYN M. 03/02/2017, 8:17 AM

## 2017-03-02 NOTE — Care Management Note (Signed)
Case Management Note  Patient Details  Name: Travis FrizzleRandy Joe Salomon MRN: [id number] Date of Birth: February 18, 1955  Subjective/Objective:                 Patient with order to DC to home today. Chart reviewed. No Home Health or Equipment orders, no unacknowledged Case Management consults or medication needs identified at the time of this note. Plan for DC to home.  CM signing off. If new needs arise today prior to discharge, please call Lawerance SabalDebbie Mafalda Mcginniss RN CM at [phone].   Action/Plan:   Expected Discharge Date:  03/02/17               Expected Discharge Plan:  Home/Self Care  In-House Referral:     Discharge planning Services  CM Consult  Post Acute Care Choice:    Choice offered to:     DME Arranged:    DME Agency:     HH Arranged:    HH Agency:     Status of Service:  Completed, signed off  If discussed at [employer] Length of Stay Meetings, dates discussed:    Additional Comments:  Lawerance SabalDebbie Xayla Puzio, RN 03/02/2017, 8:36 AM

## 2017-03-04 ENCOUNTER — Encounter (HOSPITAL_COMMUNITY): Payer: Self-pay | Admitting: Orthopedic Surgery

## 2017-03-04 NOTE — Op Note (Signed)
NAME:  Travis Simmons, Travis Simmons                       ACCOUNT NO.:  MEDICAL RECORD NO.:  [id number]  LOCATION:                                 FACILITY:  PHYSICIAN:  Almedia BallsSteven R. Ranell PatrickNorris, M.D.      DATE OF BIRTH:  DATE OF PROCEDURE:  03/01/2017 DATE OF DISCHARGE:                              OPERATIVE REPORT   PREOPERATIVE DIAGNOSIS:  Left shoulder end-stage osteoarthritis.  POSTOPERATIVE DIAGNOSIS:  Left shoulder end-stage osteoarthritis.  PROCEDURE PERFORMED:  Left total shoulder arthroplasty using DePuy Global Unite System with anchor peg glenoid.  ATTENDING SURGEON:  Almedia BallsSteven R. Ranell PatrickNorris, MD.  ASSISTANT:  [place]esto Charonhomas Brad Dixon, [facility], who was scrubbed during the entire procedure and necessary for satisfactory completion of surgery .  ANESTHESIA:  General anesthesia was used plus interscalene block.  ESTIMATED BLOOD LOSS:  100 mL.  FLUID REPLACEMENT:  1500 mL of crystalloid.  INSTRUMENT COUNTS:  Correct.  COMPLICATIONS:  There were no complications.  Perioperative antibiotics were given.  INDICATIONS:  The patient is a 62 year old male with progressively worsening left shoulder pain and function secondary to end-stage arthritis.  The patient has significant eccentric wear of the posterior glenoid and has progressed with symptoms recently.  The patient desires total shoulder arthroplasty to restore function and eliminate pain to the shoulder.  Informed consent was obtained.  DESCRIPTION OF PROCEDURE:  After an adequate level of anesthesia was achieved, the patient was positioned in the modified beach-chair position, left shoulder was correctly identified and sterilely prepped and draped in usual manner.  Time-out called.  We entered the shoulder using standard deltopectoral incision, started at the coracoid process extending down to the anterior humerus, dissection down through subcutaneous tissues using Bovie.  Cephalic vein was identified, taken laterally with the deltoid, pectoralis taken  medially.  Conjoined tendon identified and retracted medially.  We tenodesed the biceps with 0 Vicryl figure-of-eight suture, suturing into the pectoralis tendon.  We then went ahead and released the subscapularis off the lesser tuberosity subperiosteally.  We tagged that with #2 FiberWire suture in a modified W-stitch for repair at the end.  We did a capsular release off the humeral side, progressively externally rotating.  We then placed a T- handle Crego elevator over the top of the humeral head.  There was bone- on-bone wear noted.  We externally rotated the shoulder 20 degrees and performed anterior-posterior cut protecting inferiorly with the reverse Hohmann and large Geographical information systems officerCrego elevator.  With the head cut performed, we next went ahead and removed excess osteophytes off the humeral side.  Those were easily removed with a rongeur all the way around posteriorly that was primarily inferiorly.  Next, we subluxed the humerus posteriorly. We then obtained 360-degree exposure of the glenoid face.  There was a large osteophyte posteriorly, which was removed with a rongeur and a Therapist, nutritionalreer elevator.  Next, we performed a biceps tenotomy and labral debridement.  We removed the entire labrum and also released the capsule.  We were careful to protect the axillary nerve.  We freed up the subscapularis off the neck of the scapula and then at this point, we found our center point.  The most important thing for [place] was to get our version correct.  We identified the inferior pillar and the scapular body and I was able to palpate that and then direct our pin parallel to the scapular body and perpendicular to the face of what the glenoid version should be.  We knew we were going to be high anteriorly and having to ream that down some and once we removed the remaining cartilage from the front of the high glenoid, this did not look like quite a stunting of the task.  We felt like we would not have to use  the [employer] glenoid component.  So we found our center point.  We did our reaming for the 44 anchor peg glenoid.  We then drilled our central peg hole and our 3 peripheral holes and then vacuum mixed cement.  We used Gelfoam soaked in thrombin for 3 peripheral holes to dry that and then placed cement our 3 peripheral holes and impacted the APG glenoid into place.  We held that until the cement hardened.  We had excellent coverage with that 44 glenoid.  Then, we went ahead and once the cement was hard, we inspected posteriorly.  I was able to palpate all around the posterior aspect of the glenoid to make sure there were no retained loose bodies or any spurs and it was all free of that, we irrigated thoroughly.  We then completed our preparation on our humeral side.  We reamed up to a size 12 and then broached for the 12 stem and impacted the 12 stem in position.  We took the 48 x 21 eccentric humeral head and impacted that for best coverage and gave [place] good coverage, reduced the shoulder and had nice stability and good alignment, good range.  We removed all the trial components on the humeral side, irrigated thoroughly, drilled holes in the lesser tuberosity and placed #2 FiberWire suture for repair of the subscap.  We then took our real press- fit stem, which was a 12 body, 12 metaphysis and a 48 x 21 eccentric humeral head, and then again we turned that for posterosuperiorly for best coverage and had nice coverage for that.  We impacted that in position, reduced our shoulder, pleased with our soft tissue balancing and then we closed.  After thorough irrigation, we used #2 FiberWire and did a rotator interval repair as well as anatomic subscapularis repair back to bone.  We were pleased with that repair.  We ranged the shoulder.  We had nice smooth motion with no block to motion and nice stability.  At this point, we irrigated and closed deltopectoral interval with #1 Vicryl suture  followed by 2-0 Vicryl for subcutaneous closure and 4-0 Monocryl for skin.  Steri-Strips were used followed by sterile dressing.  The patient tolerated the surgery well.     Almedia Balls. Ranell Patrick, M.D.     SRN/MEDQ  D:  03/01/2017  T:  03/02/2017  Job:  [id number]

## 2017-03-05 NOTE — Addendum Note (Signed)
Addendum  created 03/05/17 1256 by Trevor IhaHouser, Kwan Shellhammer A, MD   Intraprocedure Blocks edited, Sign clinical note

## 2018-06-13 DIAGNOSIS — J45909 Unspecified asthma, uncomplicated: Secondary | ICD-10-CM | POA: Diagnosis not present

## 2018-06-13 DIAGNOSIS — M25562 Pain in left knee: Secondary | ICD-10-CM | POA: Diagnosis not present

## 2018-07-15 DIAGNOSIS — M25562 Pain in left knee: Secondary | ICD-10-CM | POA: Diagnosis not present

## 2018-08-19 DIAGNOSIS — Z96612 Presence of left artificial shoulder joint: Secondary | ICD-10-CM | POA: Diagnosis not present

## 2018-08-19 DIAGNOSIS — Z471 Aftercare following joint replacement surgery: Secondary | ICD-10-CM | POA: Diagnosis not present

## 2018-11-24 DIAGNOSIS — Z23 Encounter for immunization: Secondary | ICD-10-CM | POA: Diagnosis not present

## 2018-11-27 DIAGNOSIS — M25562 Pain in left knee: Secondary | ICD-10-CM | POA: Diagnosis not present

## 2018-12-08 DIAGNOSIS — M25562 Pain in left knee: Secondary | ICD-10-CM | POA: Diagnosis not present

## 2018-12-16 DIAGNOSIS — S83242A Other tear of medial meniscus, current injury, left knee, initial encounter: Secondary | ICD-10-CM | POA: Diagnosis not present

## 2018-12-16 DIAGNOSIS — M25562 Pain in left knee: Secondary | ICD-10-CM | POA: Diagnosis not present

## 2018-12-16 DIAGNOSIS — M1712 Unilateral primary osteoarthritis, left knee: Secondary | ICD-10-CM | POA: Diagnosis not present

## 2018-12-31 DIAGNOSIS — X58XXXA Exposure to other specified factors, initial encounter: Secondary | ICD-10-CM | POA: Diagnosis not present

## 2018-12-31 DIAGNOSIS — Y999 Unspecified external cause status: Secondary | ICD-10-CM | POA: Diagnosis not present

## 2018-12-31 DIAGNOSIS — M94262 Chondromalacia, left knee: Secondary | ICD-10-CM | POA: Diagnosis not present

## 2018-12-31 DIAGNOSIS — S83262A Peripheral tear of lateral meniscus, current injury, left knee, initial encounter: Secondary | ICD-10-CM | POA: Diagnosis not present

## 2018-12-31 DIAGNOSIS — S83232A Complex tear of medial meniscus, current injury, left knee, initial encounter: Secondary | ICD-10-CM | POA: Diagnosis not present

## 2019-07-30 DIAGNOSIS — J45909 Unspecified asthma, uncomplicated: Secondary | ICD-10-CM | POA: Diagnosis not present

## 2019-07-30 DIAGNOSIS — M2550 Pain in unspecified joint: Secondary | ICD-10-CM | POA: Diagnosis not present

## 2019-10-27 DIAGNOSIS — M25511 Pain in right shoulder: Secondary | ICD-10-CM | POA: Diagnosis not present

## 2019-11-16 DIAGNOSIS — M25511 Pain in right shoulder: Secondary | ICD-10-CM | POA: Diagnosis not present

## 2019-11-19 DIAGNOSIS — M25562 Pain in left knee: Secondary | ICD-10-CM | POA: Diagnosis not present

## 2019-11-19 DIAGNOSIS — M25511 Pain in right shoulder: Secondary | ICD-10-CM | POA: Diagnosis not present

## 2019-12-09 ENCOUNTER — Other Ambulatory Visit: Payer: Self-pay | Admitting: Orthopedic Surgery

## 2019-12-09 DIAGNOSIS — M25511 Pain in right shoulder: Secondary | ICD-10-CM

## 2019-12-15 DIAGNOSIS — Z01818 Encounter for other preprocedural examination: Secondary | ICD-10-CM | POA: Diagnosis not present

## 2019-12-15 DIAGNOSIS — Z131 Encounter for screening for diabetes mellitus: Secondary | ICD-10-CM | POA: Diagnosis not present

## 2019-12-15 DIAGNOSIS — Z23 Encounter for immunization: Secondary | ICD-10-CM | POA: Diagnosis not present

## 2019-12-22 NOTE — H&P (Signed)
Patient's anticipated LOS is less than 2 midnights, meeting these requirements: - Younger than 58 - Lives within 1 hour of care - Has a competent adult at home to recover with post-op recover - NO history of  - Chronic pain requiring opiods  - Diabetes  - Coronary Artery Disease  - Heart failure  - Heart attack  - Stroke  - DVT/VTE  - Cardiac arrhythmia  - Respiratory Failure/COPD  - Renal failure  - Anemia  - Advanced Liver disease       Travis Simmons is an 64 y.o. male.    Chief Complaint: right shoulder pain  HPI: Pt is a 64 y.o. male complaining of right shoulder pain for multiple years. Pain had continually increased since the beginning. X-rays in the clinic show end-stage arthritic changes of the right shoulder. Pt has tried various conservative treatments which have failed to alleviate their symptoms, including injections and therapy. Various options are discussed with the patient. Risks, benefits and expectations were discussed with the patient. Patient understand the risks, benefits and expectations and wishes to proceed with surgery.   PCP:  Travis Rua, MD  D/C Plans: Home  PMH: Past Medical History:  Diagnosis Date   Arthritis    Asthma    Dyspnea    with flare up    PSH: Past Surgical History:  Procedure Laterality Date   TOTAL SHOULDER ARTHROPLASTY Left 03/01/2017   TOTAL SHOULDER ARTHROPLASTY Left 03/01/2017   Procedure: LEFT SHOULDER ANATOMIC TOTAL SHOULDER ARTHROPLASTY;  Surgeon: Beverely Low, MD;  Location: [facility] OR;  Service: Orthopedics;  Laterality: Left;    Social History:  reports that he has quit smoking. He has never used smokeless tobacco. He reports that he does not drink alcohol and does not use drugs.  Allergies:  Allergies  Allergen Reactions   Naproxen Rash    Medications: No current facility-administered medications for this encounter.   Current Outpatient Medications  Medication Sig Dispense Refill   albuterol  (PROVENTIL HFA;VENTOLIN HFA) 108 (90 Base) MCG/ACT inhaler Inhale 2 puffs into the lungs every 4 (four) hours as needed for wheezing or shortness of breath.     budesonide-formoterol (SYMBICORT) 160-4.5 MCG/ACT inhaler Inhale 2 puffs into the lungs 2 (two) times daily.     meloxicam (MOBIC) 15 MG tablet Take 15 mg by mouth daily.     acetaminophen (TYLENOL) 500 MG tablet Take 1,000 mg by mouth every 8 (eight) hours as needed. (Patient not taking: Reported on 12/22/2019)     ibuprofen (ADVIL,MOTRIN) 200 MG tablet Take 600 mg by mouth 2 (two) times daily as needed for headache or mild pain. (Patient not taking: Reported on 12/22/2019)     methocarbamol (ROBAXIN) 500 MG tablet Take 1 tablet (500 mg total) by mouth 3 (three) times daily as needed. (Patient not taking: Reported on 12/22/2019) 60 tablet 1   oxyCODONE (ROXICODONE) 5 MG immediate release tablet Take 1 tablet (5 mg total) by mouth every 4 (four) hours as needed for moderate pain or severe pain. (Patient not taking: Reported on 12/22/2019) 30 tablet 0    No results found for this or any previous visit (from the past 48 hour(s)). No results found.  ROS: Pain with rom of the right upper extremity  Physical Exam: Alert and oriented 64 y.o. male in no acute distress Cranial nerves 2-12 intact Cervical spine: full rom with no tenderness, nv intact distally Chest: active breath sounds bilaterally, no wheeze rhonchi or rales Heart: regular rate and rhythm,  no murmur Abd: non tender non distended with active bowel sounds Hip is stable with rom  Right shoulder painful and weak rom nv intact distally No rashes or edema distally  Assessment/Plan Assessment: right shoulder cuff arthropathy  Plan:  Patient will undergo a right reverse total shoulder by Dr. Ranell Patrick at [facility] Risks benefits and expectations were discussed with the patient. Patient understand risks, benefits and expectations and wishes to proceed. Preoperative  templating of the joint replacement has been completed, documented, and submitted to the Operating Room personnel in order to optimize intra-operative equipment management.   Alphonsa Overall PA-C, MPAS [facility] is now [employer] [address], [address], [place], [place] [address] Phone: [phone] [email or web address] Facebook   [employer]

## 2019-12-23 ENCOUNTER — Ambulatory Visit
Admission: RE | Admit: 2019-12-23 | Discharge: 2019-12-23 | Disposition: A | Discharge status: Home / Self Care | Payer: Medicare HMO | Source: Ambulatory Visit | Attending: Orthopedic Surgery | Admitting: Orthopedic Surgery

## 2019-12-23 ENCOUNTER — Inpatient Hospital Stay: Admission: RE | Admit: 2019-12-23 | Payer: No Typology Code available for payment source | Source: Ambulatory Visit

## 2019-12-23 DIAGNOSIS — M19011 Primary osteoarthritis, right shoulder: Secondary | ICD-10-CM | POA: Diagnosis not present

## 2019-12-23 DIAGNOSIS — I700 Atherosclerosis of aorta: Secondary | ICD-10-CM | POA: Diagnosis not present

## 2019-12-23 DIAGNOSIS — D71 Functional disorders of polymorphonuclear neutrophils: Secondary | ICD-10-CM | POA: Diagnosis not present

## 2019-12-23 DIAGNOSIS — M25511 Pain in right shoulder: Secondary | ICD-10-CM

## 2019-12-23 NOTE — Progress Notes (Signed)
DUE TO COVID-19 ONLY ONE VISITOR IS ALLOWED TO COME WITH YOU AND STAY IN THE WAITING ROOM ONLY DURING PRE OP AND PROCEDURE DAY OF SURGERY. THE 1 VISITOR  MAY VISIT WITH YOU AFTER SURGERY IN YOUR PRIVATE ROOM DURING VISITING HOURS ONLY!  YOU NEED TO HAVE A COVID 19 TEST ON___11/16/2021 ____ @_______ , THIS TEST MUST BE DONE BEFORE SURGERY,  COVID TESTING SITE [address] [place] , IT IS ON THE RIGHT GOING OUT [address] APPROXIMATELY  2 MINUTES PAST [employer] ON THE RIGHT. ONCE YOUR COVID TEST IS COMPLETED,  PLEASE BEGIN THE QUARANTINE INSTRUCTIONS AS OUTLINED IN YOUR HANDOUT.                Travis Simmons  12/23/2019   Your procedure is scheduled on: 01/01/2020    Report to [facility] Main  Entrance   Report to admitting at    1240 pm     Call this number if you have problems the morning of surgery [phone]    REMEMBER: NO  SOLID FOOD CANDY OR GUM AFTER MIDNIGHT. CLEAR LIQUIDS UNTIL   12 noon        . NOTHING BY MOUTH EXCEPT CLEAR LIQUIDS UNTIL    . PLEASE FINISH ENSURE DRINK PER SURGEON ORDER  WHICH NEEDS TO BE COMPLETED AT 12 noon     .      CLEAR LIQUID DIET   Foods Allowed                                                                    Coffee and tea, regular and decaf                            Fruit ices (not with fruit pulp)                                      Iced Popsicles                                    Carbonated beverages, regular and diet                                    Cranberry, grape and apple juices Sports drinks like Gatorade Lightly seasoned clear broth or consume(fat free) Sugar, honey syrup ___________________________________________________________________      BRUSH YOUR TEETH MORNING OF SURGERY AND RINSE YOUR MOUTH OUT, NO CHEWING GUM CANDY OR MINTS.     Take these medicines the morning of surgery with A SIP OF WATER:   DO NOT TAKE ANY DIABETIC MEDICATIONS DAY OF YOUR SURGERY                                You may not have any metal on your body including hair pins and              piercings  Do  not wear jewelry, make-up, lotions, powders or perfumes, deodorant             Do not wear nail polish on your fingernails.  Do not shave  48 hours prior to surgery.              Men may shave face and neck.   Do not bring valuables to the hospital. [place].  Contacts, dentures or bridgework may not be worn into surgery.  Leave suitcase in the car. After surgery it may be brought to your room.     Patients discharged the day of surgery will not be allowed to drive home. IF YOU ARE HAVING SURGERY AND GOING HOME THE SAME DAY, YOU MUST HAVE AN ADULT TO DRIVE YOU HOME AND BE WITH YOU FOR 24 HOURS. YOU MAY GO HOME BY TAXI OR UBER OR ORTHERWISE, BUT AN ADULT MUST ACCOMPANY YOU HOME AND STAY WITH YOU FOR 24 HOURS.  Name and phone number of your driver:  Special Instructions: N/A              Please read over the following fact sheets you were given: _____________________________________________________________________  [facility] - Preparing for Surgery Before surgery, you can play an important role.  Because skin is not sterile, your skin needs to be as free of germs as possible.  You can reduce the number of germs on your skin by washing with CHG (chlorahexidine gluconate) soap before surgery.  CHG is an antiseptic cleaner which kills germs and bonds with the skin to continue killing germs even after washing. Please DO NOT use if you have an allergy to CHG or antibacterial soaps.  If your skin becomes reddened/irritated stop using the CHG and inform your nurse when you arrive at Short Stay. Do not shave (including legs and underarms) for at least 48 hours prior to the first CHG shower.  You may shave your face/neck. Please follow these instructions carefully:  1.  Shower with CHG Soap the night before surgery and the  morning of  Surgery.  2.  If you choose to wash your hair, wash your hair first as usual with your  normal  shampoo.  3.  After you shampoo, rinse your hair and body thoroughly to remove the  shampoo.                           4.  Use CHG as you would any other liquid soap.  You can apply chg directly  to the skin and wash                       Gently with a scrungie or clean washcloth.  5.  Apply the CHG Soap to your body ONLY FROM THE NECK DOWN.   Do not use on face/ open                           Wound or open sores. Avoid contact with eyes, ears mouth and genitals (private parts).                       Wash face,  Genitals (private parts) with your normal soap.             6.  Wash thoroughly,  paying special attention to the area where your surgery  will be performed.  7.  Thoroughly rinse your body with warm water from the neck down.  8.  DO NOT shower/wash with your normal soap after using and rinsing off  the CHG Soap.                9.  Pat yourself dry with a clean towel.            10.  Wear clean pajamas.            11.  Place clean sheets on your bed the night of your first shower and do not  sleep with pets. Day of Surgery : Do not apply any lotions/deodorants the morning of surgery.  Please wear clean clothes to the hospital/surgery center.  FAILURE TO FOLLOW THESE INSTRUCTIONS MAY RESULT IN THE CANCELLATION OF YOUR SURGERY PATIENT SIGNATURE_________________________________  NURSE SIGNATURE__________________________________  ________________________________________________________________________

## 2019-12-24 ENCOUNTER — Encounter (HOSPITAL_COMMUNITY)
Admission: RE | Admit: 2019-12-24 | Discharge: 2019-12-24 | Disposition: A | Discharge status: Home / Self Care | Payer: Medicare HMO | Source: Ambulatory Visit | Attending: Orthopedic Surgery | Admitting: Orthopedic Surgery

## 2019-12-24 ENCOUNTER — Other Ambulatory Visit: Payer: Self-pay

## 2019-12-24 ENCOUNTER — Encounter (HOSPITAL_COMMUNITY): Payer: Self-pay

## 2019-12-24 DIAGNOSIS — Z01812 Encounter for preprocedural laboratory examination: Secondary | ICD-10-CM | POA: Diagnosis present

## 2019-12-24 HISTORY — DX: Gastro-esophageal reflux disease without esophagitis: K21.9

## 2019-12-24 LAB — CBC
HCT: 44.3 % (ref 39.0–52.0)
Hemoglobin: 15.4 g/dL (ref 13.0–17.0)
MCH: 32.8 pg (ref 26.0–34.0)
MCHC: 34.8 g/dL (ref 30.0–36.0)
MCV: 94.3 fL (ref 80.0–100.0)
Platelets: 205 10*3/uL (ref 150–400)
RBC: 4.7 MIL/uL (ref 4.22–5.81)
RDW: 12.4 % (ref 11.5–15.5)
WBC: 4.7 10*3/uL (ref 4.0–10.5)
nRBC: 0 % (ref 0.0–0.2)

## 2019-12-24 LAB — SURGICAL PCR SCREEN
MRSA, PCR: NEGATIVE
Staphylococcus aureus: NEGATIVE

## 2019-12-24 NOTE — Progress Notes (Signed)
[facility]- Preparing for Total Shoulder Arthroplasty    Before surgery, you can play an important role. Because skin is not sterile, your skin needs to be as free of germs as possible. You can reduce the number of germs on your skin by using the following products. . Benzoyl Peroxide Gel o Reduces the number of germs present on the skin o Applied twice a day to shoulder area starting two days before surgery    ==================================================================  Please follow these instructions carefully:  BENZOYL PEROXIDE 5% GEL  Please do not use if you have an allergy to benzoyl peroxide.   If your skin becomes reddened/irritated stop using the benzoyl peroxide.  Starting two days before surgery, apply as follows: 1. Apply benzoyl peroxide in the morning and at night. Apply after taking a shower. If you are not taking a shower clean entire shoulder front, back, and side along with the armpit with a clean wet washcloth.  2. Place a quarter-sized dollop on your shoulder and rub in thoroughly, making sure to cover the front, back, and side of your shoulder, along with the armpit.   2 days before ____ AM   ____ PM              1 day before ____ AM   ____ PM                         3. Do this twice a day for two days.  (Last application is the night before surgery, AFTER using the CHG soap as described below).  4. Do NOT apply benzoyl peroxide gel on the day of surgery.  

## 2019-12-25 NOTE — Progress Notes (Signed)
Pt reported on 12/24/19 had negative covid test approximately 3 weeks ago at [facility] at [place].  Called [facility] and pharmacy reported negative covid test on 11/30/2019.

## 2019-12-29 ENCOUNTER — Other Ambulatory Visit (HOSPITAL_COMMUNITY)
Admission: RE | Admit: 2019-12-29 | Discharge: 2019-12-29 | Disposition: A | Discharge status: Home / Self Care | Payer: Medicare HMO | Source: Ambulatory Visit | Attending: Orthopedic Surgery | Admitting: Orthopedic Surgery

## 2019-12-29 DIAGNOSIS — Z01812 Encounter for preprocedural laboratory examination: Secondary | ICD-10-CM | POA: Diagnosis not present

## 2019-12-29 DIAGNOSIS — Z20822 Contact with and (suspected) exposure to covid-19: Secondary | ICD-10-CM | POA: Insufficient documentation

## 2019-12-29 LAB — SARS CORONAVIRUS 2 (TAT 6-24 HRS): SARS Coronavirus 2: NEGATIVE

## 2020-01-01 ENCOUNTER — Observation Stay (HOSPITAL_COMMUNITY): Payer: Medicare HMO

## 2020-01-01 ENCOUNTER — Encounter (HOSPITAL_COMMUNITY): Payer: Self-pay | Admitting: Orthopedic Surgery

## 2020-01-01 ENCOUNTER — Encounter (HOSPITAL_COMMUNITY)
Admission: RE | Disposition: A | Discharge status: Home / Self Care | Payer: Self-pay | Source: Home / Self Care | Attending: Orthopedic Surgery

## 2020-01-01 ENCOUNTER — Ambulatory Visit (HOSPITAL_COMMUNITY): Payer: Medicare HMO | Admitting: Anesthesiology

## 2020-01-01 ENCOUNTER — Other Ambulatory Visit: Payer: Self-pay

## 2020-01-01 ENCOUNTER — Observation Stay (HOSPITAL_COMMUNITY)
Admission: RE | Admit: 2020-01-01 | Discharge: 2020-01-02 | Disposition: A | Discharge status: Home / Self Care | Payer: Medicare HMO | Attending: Orthopedic Surgery | Admitting: Orthopedic Surgery

## 2020-01-01 DIAGNOSIS — Z79899 Other long term (current) drug therapy: Secondary | ICD-10-CM | POA: Insufficient documentation

## 2020-01-01 DIAGNOSIS — J45909 Unspecified asthma, uncomplicated: Secondary | ICD-10-CM | POA: Insufficient documentation

## 2020-01-01 DIAGNOSIS — Z96611 Presence of right artificial shoulder joint: Secondary | ICD-10-CM | POA: Diagnosis not present

## 2020-01-01 DIAGNOSIS — Z96612 Presence of left artificial shoulder joint: Secondary | ICD-10-CM | POA: Insufficient documentation

## 2020-01-01 DIAGNOSIS — M19011 Primary osteoarthritis, right shoulder: Secondary | ICD-10-CM | POA: Diagnosis not present

## 2020-01-01 DIAGNOSIS — M25511 Pain in right shoulder: Secondary | ICD-10-CM | POA: Diagnosis present

## 2020-01-01 DIAGNOSIS — Z87891 Personal history of nicotine dependence: Secondary | ICD-10-CM | POA: Diagnosis not present

## 2020-01-01 DIAGNOSIS — Z471 Aftercare following joint replacement surgery: Secondary | ICD-10-CM | POA: Diagnosis not present

## 2020-01-01 DIAGNOSIS — M7581 Other shoulder lesions, right shoulder: Secondary | ICD-10-CM | POA: Diagnosis not present

## 2020-01-01 DIAGNOSIS — K219 Gastro-esophageal reflux disease without esophagitis: Secondary | ICD-10-CM | POA: Diagnosis not present

## 2020-01-01 DIAGNOSIS — G8918 Other acute postprocedural pain: Secondary | ICD-10-CM | POA: Diagnosis not present

## 2020-01-01 HISTORY — PX: REVERSE SHOULDER ARTHROPLASTY: SHX5054

## 2020-01-01 SURGERY — ARTHROPLASTY, SHOULDER, TOTAL, REVERSE
Anesthesia: General | Site: Shoulder | Laterality: Right

## 2020-01-01 MED ORDER — STERILE WATER FOR IRRIGATION IR SOLN
Status: DC | PRN
  Administered 2020-01-01: 2000 mL

## 2020-01-01 MED ORDER — DOCUSATE SODIUM 100 MG PO CAPS
100.0000 mg | ORAL_CAPSULE | Freq: Two times a day (BID) | ORAL | Status: DC
  Administered 2020-01-01 – 2020-01-02 (×2): 100 mg via ORAL
  Filled 2020-01-01 (×2): qty 1

## 2020-01-01 MED ORDER — CEFAZOLIN SODIUM-DEXTROSE 2-4 GM/100ML-% IV SOLN
2.0000 g | Freq: Four times a day (QID) | INTRAVENOUS | Status: AC
  Administered 2020-01-01 – 2020-01-02 (×3): 2 g via INTRAVENOUS
  Filled 2020-01-01 (×3): qty 100

## 2020-01-01 MED ORDER — FENTANYL CITRATE (PF) 100 MCG/2ML IJ SOLN
25.0000 ug | INTRAMUSCULAR | Status: DC | PRN
  Administered 2020-01-01 (×3): 50 ug via INTRAVENOUS

## 2020-01-01 MED ORDER — ALBUTEROL SULFATE HFA 108 (90 BASE) MCG/ACT IN AERS
INHALATION_SPRAY | RESPIRATORY_TRACT | Status: DC | PRN
  Administered 2020-01-01 (×2): 2 via RESPIRATORY_TRACT

## 2020-01-01 MED ORDER — ONDANSETRON HCL 4 MG PO TABS: 4.0000 mg | ORAL_TABLET | Freq: Four times a day (QID) | ORAL | Status: DC | PRN

## 2020-01-01 MED ORDER — PHENOL 1.4 % MT LIQD: 1.0000 | OROMUCOSAL | Status: DC | PRN

## 2020-01-01 MED ORDER — LIDOCAINE 2% (20 MG/ML) 5 ML SYRINGE
INTRAMUSCULAR | Status: DC | PRN
  Administered 2020-01-01: 100 mg via INTRAVENOUS

## 2020-01-01 MED ORDER — FENTANYL CITRATE (PF) 100 MCG/2ML IJ SOLN
50.0000 ug | INTRAMUSCULAR | Status: DC
  Administered 2020-01-01 (×3): 50 ug via INTRAVENOUS
  Filled 2020-01-01: qty 2

## 2020-01-01 MED ORDER — 0.9 % SODIUM CHLORIDE (POUR BTL) OPTIME
TOPICAL | Status: DC | PRN
  Administered 2020-01-01: 1000 mL

## 2020-01-01 MED ORDER — ACETAMINOPHEN 325 MG PO TABS: 325.0000 mg | ORAL_TABLET | Freq: Four times a day (QID) | ORAL | Status: DC | PRN

## 2020-01-01 MED ORDER — BUPIVACAINE-EPINEPHRINE (PF) 0.25% -1:200000 IJ SOLN
INTRAMUSCULAR | Status: AC
  Filled 2020-01-01: qty 30

## 2020-01-01 MED ORDER — BUPIVACAINE-EPINEPHRINE (PF) 0.25% -1:200000 IJ SOLN
INTRAMUSCULAR | Status: DC | PRN
  Administered 2020-01-01: 15 mL

## 2020-01-01 MED ORDER — ORAL CARE MOUTH RINSE: 15.0000 mL | Freq: Once | OROMUCOSAL | Status: AC

## 2020-01-01 MED ORDER — PHENYLEPHRINE HCL-NACL 10-0.9 MG/250ML-% IV SOLN
INTRAVENOUS | Status: DC | PRN
  Administered 2020-01-01: 50 ug/min via INTRAVENOUS

## 2020-01-01 MED ORDER — OXYCODONE HCL 5 MG PO TABS: 5.0000 mg | ORAL_TABLET | Freq: Once | ORAL | Status: DC | PRN

## 2020-01-01 MED ORDER — ONDANSETRON HCL 4 MG/2ML IJ SOLN
INTRAMUSCULAR | Status: DC | PRN
  Administered 2020-01-01: 4 mg via INTRAVENOUS

## 2020-01-01 MED ORDER — BISACODYL 10 MG RE SUPP: 10.0000 mg | Freq: Every day | RECTAL | Status: DC | PRN

## 2020-01-01 MED ORDER — SODIUM CHLORIDE 0.9 % IV SOLN: INTRAVENOUS | Status: DC

## 2020-01-01 MED ORDER — OXYCODONE-ACETAMINOPHEN 5-325 MG PO TABS
1.0000 | ORAL_TABLET | Freq: Four times a day (QID) | ORAL | 0 refills | Status: AC | PRN
Start: 2020-01-01 — End: 2020-12-31

## 2020-01-01 MED ORDER — MIDAZOLAM HCL 2 MG/2ML IJ SOLN
1.0000 mg | INTRAMUSCULAR | Status: DC
  Administered 2020-01-01 (×2): 2 mg via INTRAVENOUS
  Filled 2020-01-01: qty 2

## 2020-01-01 MED ORDER — POLYETHYLENE GLYCOL 3350 17 G PO PACK: 17.0000 g | PACK | Freq: Every day | ORAL | Status: DC | PRN

## 2020-01-01 MED ORDER — MENTHOL 3 MG MT LOZG: 1.0000 | LOZENGE | OROMUCOSAL | Status: DC | PRN

## 2020-01-01 MED ORDER — FENTANYL CITRATE (PF) 100 MCG/2ML IJ SOLN
INTRAMUSCULAR | Status: AC
  Filled 2020-01-01: qty 2

## 2020-01-01 MED ORDER — EPHEDRINE SULFATE-NACL 50-0.9 MG/10ML-% IV SOSY
PREFILLED_SYRINGE | INTRAVENOUS | Status: DC | PRN
  Administered 2020-01-01: 10 mg via INTRAVENOUS

## 2020-01-01 MED ORDER — METOCLOPRAMIDE HCL 5 MG/ML IJ SOLN: 5.0000 mg | Freq: Three times a day (TID) | INTRAMUSCULAR | Status: DC | PRN

## 2020-01-01 MED ORDER — OXYCODONE HCL 5 MG/5ML PO SOLN: 5.0000 mg | Freq: Once | ORAL | Status: DC | PRN

## 2020-01-01 MED ORDER — ACETAMINOPHEN 10 MG/ML IV SOLN: 1000.0000 mg | Freq: Once | INTRAVENOUS | Status: DC | PRN

## 2020-01-01 MED ORDER — ALBUTEROL SULFATE HFA 108 (90 BASE) MCG/ACT IN AERS: 2.0000 | INHALATION_SPRAY | RESPIRATORY_TRACT | Status: DC | PRN

## 2020-01-01 MED ORDER — METHOCARBAMOL 1000 MG/10ML IJ SOLN
500.0000 mg | Freq: Four times a day (QID) | INTRAVENOUS | Status: DC | PRN
  Filled 2020-01-01: qty 5

## 2020-01-01 MED ORDER — MIDAZOLAM HCL 2 MG/2ML IJ SOLN
INTRAMUSCULAR | Status: AC
  Filled 2020-01-01: qty 2

## 2020-01-01 MED ORDER — ALBUTEROL SULFATE HFA 108 (90 BASE) MCG/ACT IN AERS
INHALATION_SPRAY | RESPIRATORY_TRACT | Status: AC
  Filled 2020-01-01: qty 6.7

## 2020-01-01 MED ORDER — METOCLOPRAMIDE HCL 5 MG PO TABS: 5.0000 mg | ORAL_TABLET | Freq: Three times a day (TID) | ORAL | Status: DC | PRN

## 2020-01-01 MED ORDER — ONDANSETRON HCL 4 MG/2ML IJ SOLN
4.0000 mg | Freq: Four times a day (QID) | INTRAMUSCULAR | Status: DC | PRN
  Administered 2020-01-02: 4 mg via INTRAVENOUS
  Filled 2020-01-01: qty 2

## 2020-01-01 MED ORDER — MOMETASONE FURO-FORMOTEROL FUM 200-5 MCG/ACT IN AERO
2.0000 | INHALATION_SPRAY | Freq: Two times a day (BID) | RESPIRATORY_TRACT | Status: DC
  Administered 2020-01-01 – 2020-01-02 (×2): 2 via RESPIRATORY_TRACT
  Filled 2020-01-01: qty 8.8

## 2020-01-01 MED ORDER — ONDANSETRON HCL 4 MG/2ML IJ SOLN: 4.0000 mg | Freq: Once | INTRAMUSCULAR | Status: DC | PRN

## 2020-01-01 MED ORDER — DEXAMETHASONE SODIUM PHOSPHATE 10 MG/ML IJ SOLN
INTRAMUSCULAR | Status: DC | PRN
  Administered 2020-01-01: 10 mg via INTRAVENOUS

## 2020-01-01 MED ORDER — CEFAZOLIN SODIUM-DEXTROSE 2-4 GM/100ML-% IV SOLN
2.0000 g | INTRAVENOUS | Status: AC
  Administered 2020-01-01: 2 g via INTRAVENOUS
  Filled 2020-01-01: qty 100

## 2020-01-01 MED ORDER — OXYCODONE HCL 5 MG PO TABS
5.0000 mg | ORAL_TABLET | ORAL | Status: DC | PRN
  Administered 2020-01-01 – 2020-01-02 (×2): 5 mg via ORAL
  Filled 2020-01-01 (×2): qty 1

## 2020-01-01 MED ORDER — METHOCARBAMOL 500 MG PO TABS: 500.0000 mg | ORAL_TABLET | Freq: Three times a day (TID) | ORAL | 1 refills | Status: DC | PRN

## 2020-01-01 MED ORDER — ONDANSETRON HCL 4 MG PO TABS: 4.0000 mg | ORAL_TABLET | Freq: Three times a day (TID) | ORAL | 1 refills | Status: AC | PRN

## 2020-01-01 MED ORDER — PROPOFOL 10 MG/ML IV BOLUS
INTRAVENOUS | Status: DC | PRN
  Administered 2020-01-01: 160 mg via INTRAVENOUS

## 2020-01-01 MED ORDER — ESMOLOL HCL 100 MG/10ML IV SOLN
INTRAVENOUS | Status: DC | PRN
  Administered 2020-01-01: 20 mg via INTRAVENOUS

## 2020-01-01 MED ORDER — METHOCARBAMOL 500 MG PO TABS
500.0000 mg | ORAL_TABLET | Freq: Four times a day (QID) | ORAL | Status: DC | PRN
  Administered 2020-01-01: 500 mg via ORAL
  Filled 2020-01-01: qty 1

## 2020-01-01 MED ORDER — MELOXICAM 15 MG PO TABS
15.0000 mg | ORAL_TABLET | Freq: Every day | ORAL | Status: DC
  Administered 2020-01-02: 15 mg via ORAL
  Filled 2020-01-01: qty 1

## 2020-01-01 MED ORDER — LACTATED RINGERS IV SOLN: INTRAVENOUS | Status: DC

## 2020-01-01 MED ORDER — PROPOFOL 10 MG/ML IV BOLUS
INTRAVENOUS | Status: AC
  Filled 2020-01-01: qty 20

## 2020-01-01 MED ORDER — SUCCINYLCHOLINE CHLORIDE 200 MG/10ML IV SOSY
PREFILLED_SYRINGE | INTRAVENOUS | Status: DC | PRN
  Administered 2020-01-01: 140 mg via INTRAVENOUS

## 2020-01-01 MED ORDER — HYDROMORPHONE HCL 1 MG/ML IJ SOLN: 0.5000 mg | INTRAMUSCULAR | Status: DC | PRN

## 2020-01-01 MED ORDER — CHLORHEXIDINE GLUCONATE 0.12 % MT SOLN
15.0000 mL | Freq: Once | OROMUCOSAL | Status: AC
  Administered 2020-01-01: 15 mL via OROMUCOSAL

## 2020-01-01 SURGICAL SUPPLY — 71 items
BAG ZIPLOCK 12X15 (MISCELLANEOUS) IMPLANT
BASEPLATE AUG FULL 24X20 +2 (Plate) ×3 IMPLANT
BIT DRILL 1.6MX128 (BIT) IMPLANT
BIT DRILL 1.6MX128MM (BIT)
BIT DRILL FLUTED 3.0 STRL (BIT) ×3 IMPLANT
BLADE SAG 18X100X1.27 (BLADE) ×3 IMPLANT
CLOSURE WOUND 1/2 X4 (GAUZE/BANDAGES/DRESSINGS) ×1
COVER BACK TABLE 60X90IN (DRAPES) ×3 IMPLANT
COVER SURGICAL LIGHT HANDLE (MISCELLANEOUS) ×3 IMPLANT
COVER WAND RF STERILE (DRAPES) IMPLANT
CUP SUT UNIV REVERS 39 +2 (Shoulder) ×3 IMPLANT
DECANTER SPIKE VIAL GLASS SM (MISCELLANEOUS) ×3 IMPLANT
DRAPE INCISE IOBAN 66X45 STRL (DRAPES) ×3 IMPLANT
DRAPE ORTHO SPLIT 77X108 STRL (DRAPES) ×4
DRAPE SHEET LG 3/4 BI-LAMINATE (DRAPES) ×3 IMPLANT
DRAPE SURG ORHT 6 SPLT 77X108 (DRAPES) ×2 IMPLANT
DRAPE TOP 10253 STERILE (DRAPES) ×3 IMPLANT
DRAPE U-SHAPE 47X51 STRL (DRAPES) ×3 IMPLANT
DRSG ADAPTIC 3X8 NADH LF (GAUZE/BANDAGES/DRESSINGS) ×3 IMPLANT
DRSG PAD ABDOMINAL 8X10 ST (GAUZE/BANDAGES/DRESSINGS) ×3 IMPLANT
DURAPREP 26ML APPLICATOR (WOUND CARE) ×3 IMPLANT
ELECT BLADE TIP CTD 4 INCH (ELECTRODE) ×6 IMPLANT
ELECT NEEDLE TIP 2.8 STRL (NEEDLE) ×3 IMPLANT
ELECT REM PT RETURN 15FT ADLT (MISCELLANEOUS) ×3 IMPLANT
FACESHIELD WRAPAROUND (MASK) ×6 IMPLANT
GAUZE SPONGE 4X4 12PLY STRL (GAUZE/BANDAGES/DRESSINGS) ×3 IMPLANT
GLENOSPHERE 39+4 LAT/24 UNI RV (Joint) ×3 IMPLANT
GLOVE BIOGEL PI ORTHO PRO 7.5 (GLOVE) ×2
GLOVE BIOGEL PI ORTHO PRO SZ8 (GLOVE) ×2
GLOVE ORTHO TXT STRL SZ7.5 (GLOVE) ×3 IMPLANT
GLOVE PI ORTHO PRO STRL 7.5 (GLOVE) ×1 IMPLANT
GLOVE PI ORTHO PRO STRL SZ8 (GLOVE) ×1 IMPLANT
GLOVE SURG ORTHO 8.5 STRL (GLOVE) ×3 IMPLANT
GOWN STRL REUS W/TWL XL LVL3 (GOWN DISPOSABLE) ×6 IMPLANT
INSERT HUMERAL MED 39/ +3 (Shoulder) ×1 IMPLANT
INSERT MEDIUM HUMERAL 39/ +3 (Shoulder) ×2 IMPLANT
KIT BASIN OR (CUSTOM PROCEDURE TRAY) ×3 IMPLANT
KIT TURNOVER KIT A (KITS) IMPLANT
MANIFOLD NEPTUNE II (INSTRUMENTS) ×3 IMPLANT
NEEDLE MAYO CATGUT SZ4 (NEEDLE) IMPLANT
NS IRRIG 1000ML POUR BTL (IV SOLUTION) ×3 IMPLANT
PACK SHOULDER (CUSTOM PROCEDURE TRAY) ×3 IMPLANT
PENCIL SMOKE EVACUATOR (MISCELLANEOUS) IMPLANT
PIN FLUTED HEDLESS FIX 3.5X1/8 (PIN) ×3 IMPLANT
PIN NITINOL TARGETER 2.8 (PIN) ×3 IMPLANT
PIN SET MODULAR GLENOID SYSTEM (PIN) ×3 IMPLANT
POST MODULAR 25 (Post) ×2 IMPLANT
POST MODULAR MGS BASEPLATE 25 (Post) ×1 IMPLANT
PROTECTOR NERVE ULNAR (MISCELLANEOUS) ×3 IMPLANT
REAMER ANGLED HEAD SMALL (DRILL) ×3 IMPLANT
RESTRAINT HEAD UNIVERSAL NS (MISCELLANEOUS) ×3 IMPLANT
SCREW PERI LOCK 5.5X16 (Screw) ×3 IMPLANT
SCREW PERIPHERAL 5.5X20 LOCK (Screw) ×3 IMPLANT
SCREW PERIPHERAL NL 4.5X28 (Screw) ×3 IMPLANT
SCREW PERIPHERAL NL 4.5X36 (Screw) ×3 IMPLANT
SLING ARM FOAM STRAP LRG (SOFTGOODS) ×3 IMPLANT
SMARTMIX MINI TOWER (MISCELLANEOUS)
SPONGE LAP 4X18 RFD (DISPOSABLE) IMPLANT
STEM HUMERAL UNI REVERSE SZ10 (Stem) ×3 IMPLANT
STRIP CLOSURE SKIN 1/2X4 (GAUZE/BANDAGES/DRESSINGS) ×2 IMPLANT
SUCTION FRAZIER HANDLE 10FR (MISCELLANEOUS) ×2
SUCTION TUBE FRAZIER 10FR DISP (MISCELLANEOUS) ×1 IMPLANT
SUT FIBERWIRE #2 38 T-5 BLUE (SUTURE) ×6
SUT MNCRL AB 4-0 PS2 18 (SUTURE) ×3 IMPLANT
SUT VIC AB 0 CT1 36 (SUTURE) ×6 IMPLANT
SUT VIC AB 0 CT2 27 (SUTURE) ×3 IMPLANT
SUT VIC AB 2-0 CT1 27 (SUTURE) ×2
SUT VIC AB 2-0 CT1 TAPERPNT 27 (SUTURE) ×1 IMPLANT
SUTURE FIBERWR #2 38 T-5 BLUE (SUTURE) ×2 IMPLANT
TOWEL OR 17X26 10 PK STRL BLUE (TOWEL DISPOSABLE) ×3 IMPLANT
TOWER SMARTMIX MINI (MISCELLANEOUS) IMPLANT

## 2020-01-01 NOTE — Plan of Care (Signed)
  Problem: Education: Goal: Knowledge of General Education information will improve Description Including pain rating scale, medication(s)/side effects and non-pharmacologic comfort measures Outcome: Progressing   

## 2020-01-01 NOTE — Anesthesia Procedure Notes (Addendum)
Procedure Name: Intubation Date/Time: 01/01/2020 3:49 PM Performed by: Jorene Minors, CRNA Pre-anesthesia Checklist: Patient identified, Emergency Drugs available, Suction available and Patient being monitored Patient Re-evaluated:Patient Re-evaluated prior to induction Oxygen Delivery Method: Circle system utilized Preoxygenation: Pre-oxygenation with 100% oxygen Induction Type: IV induction Ventilation: Mask ventilation without difficulty Laryngoscope Size: Miller and 3 Grade View: Grade I Tube type: Oral Number of attempts: 1 Airway Equipment and Method: Stylet and Oral airway Placement Confirmation: ETT inserted through vocal cords under direct vision,  positive ETCO2 and breath sounds checked- equal and bilateral Secured at: 25 cm Tube secured with: Tape Dental Injury: Teeth and Oropharynx as per pre-operative assessment  Comments: Intubation performed by Aquilla Hacker, SRNA under direct supervision by Maylon Cos, CRNA and Eilene Ghazi, MD

## 2020-01-01 NOTE — Anesthesia Preprocedure Evaluation (Signed)
Anesthesia Evaluation  Patient identified by MRN, date of birth, ID band Patient awake    Reviewed: Allergy & Precautions, NPO status , Patient's Chart, lab work & pertinent test results  Airway Mallampati: II  TM Distance: >3 FB Neck ROM: Full    Dental no notable dental hx.    Pulmonary asthma , former smoker,    Pulmonary exam normal breath sounds clear to auscultation       Cardiovascular negative cardio ROS Normal cardiovascular exam Rhythm:Regular Rate:Normal     Neuro/Psych negative neurological ROS  negative psych ROS   GI/Hepatic Neg liver ROS, GERD  ,  Endo/Other  negative endocrine ROS  Renal/GU negative Renal ROS  negative genitourinary   Musculoskeletal negative musculoskeletal ROS (+)   Abdominal   Peds negative pediatric ROS (+)  Hematology negative hematology ROS (+)   Anesthesia Other Findings   Reproductive/Obstetrics negative OB ROS                            Anesthesia Physical Anesthesia Plan  ASA: II  Anesthesia Plan: General   Post-op Pain Management:    Induction: Intravenous  PONV Risk Score and Plan: 2 and Ondansetron, Dexamethasone and Treatment may vary due to age or medical condition  Airway Management Planned: Oral ETT  Additional Equipment:   Intra-op Plan:   Post-operative Plan: Extubation in OR  Informed Consent: I have reviewed the patients History and Physical, chart, labs and discussed the procedure including the risks, benefits and alternatives for the proposed anesthesia with the patient or authorized representative who has indicated his/her understanding and acceptance.     Dental advisory given  Plan Discussed with: CRNA and Surgeon  Anesthesia Plan Comments:         Anesthesia Quick Evaluation  

## 2020-01-01 NOTE — Discharge Instructions (Signed)
Ice to the shoulder constantly.  Keep the incision covered and clean and dry for one week, then ok to get it wet in the shower. ° °Do exercise as instructed several times per day. ° °DO NOT reach behind your back or push up out of a chair with the operative arm. ° °Use a sling while you are up and around for comfort, may remove while seated.  Keep pillow propped behind the operative elbow. ° °Follow up with Dr Jacolby Risby in two weeks in the office, call [phone] for appt °

## 2020-01-01 NOTE — Progress Notes (Signed)
Assisted Dr. Rose with right, ultrasound guided, interscalene  block. Side rails up, monitors on throughout procedure. See vital signs in flow sheet. Tolerated Procedure well. 

## 2020-01-01 NOTE — Progress Notes (Signed)
Handbook has been given to patient.  

## 2020-01-01 NOTE — Anesthesia Postprocedure Evaluation (Signed)
Anesthesia Post Note  Patient: Travis Simmons  Procedure(s) Performed: REVERSE SHOULDER ARTHROPLASTY (Right Shoulder)     Patient location during evaluation: PACU Anesthesia Type: General Level of consciousness: awake and alert Pain management: pain level controlled Vital Signs Assessment: post-procedure vital signs reviewed and stable Respiratory status: spontaneous breathing, nonlabored ventilation, respiratory function stable and patient connected to nasal cannula oxygen Cardiovascular status: blood pressure returned to baseline and stable Postop Assessment: no apparent nausea or vomiting Anesthetic complications: no   No complications documented.  Last Vitals:  Vitals:   01/01/20 1900 01/01/20 2000  BP: 135/73 134/63  Pulse: (!) 43 61  Resp: 15 15  Temp: 36.5 C   SpO2: 97% 99%    Last Pain:  Vitals:   01/01/20 2000  TempSrc:   PainSc: 4                  Travis Simmons S

## 2020-01-01 NOTE — Transfer of Care (Signed)
Immediate Anesthesia Transfer of Care Note  Patient: Damean Poffenberger  Procedure(s) Performed: REVERSE SHOULDER ARTHROPLASTY (Right Shoulder)  Patient Location: PACU  Anesthesia Type:GA combined with regional for post-op pain  Level of Consciousness: awake, sedated, patient cooperative and responds to stimulation  Airway & Oxygen Therapy: Patient Spontanous Breathing and Patient connected to face mask oxygen  Post-op Assessment: Report given to RN and Post -op Vital signs reviewed and stable  Post vital signs: Reviewed and stable  Last Vitals:  Vitals Value Taken Time  BP 136/63 01/01/20 1800  Temp    Pulse 61 01/01/20 1803  Resp    SpO2 99 % 01/01/20 1803  Vitals shown include unvalidated device data.  Last Pain:  Vitals:   01/01/20 1346  TempSrc: Oral         Complications: No complications documented.

## 2020-01-01 NOTE — Brief Op Note (Signed)
01/01/2020  5:46 PM  PATIENT:  Travis Simmons  64 y.o. male  PRE-OPERATIVE DIAGNOSIS:  Right shoulder osteoarthritis rotator cuff insufficiency  POST-OPERATIVE DIAGNOSIS:  Right shoulder osteoarthritis rotator cuff insufficiency  PROCEDURE:  Procedure(s) with comments: REVERSE SHOULDER ARTHROPLASTY (Right) - interscalene block no subscap repair  SURGEON:  Surgeon(s) and Role:    Beverely Low, MD - Primary    * Aundria Rud Noah Delaine, MD  PHYSICIAN ASSISTANT:   ASSISTANTS: Thea Gist, PA-C   ANESTHESIA:   regional and general  EBL:  75 mL   BLOOD ADMINISTERED:none  DRAINS: none   LOCAL MEDICATIONS USED:  MARCAINE     SPECIMEN:  No Specimen  DISPOSITION OF SPECIMEN:  N/A  COUNTS:  YES  TOURNIQUET:  * No tourniquets in log *  DICTATION: .Note written in EPIC and Other Dictation: Dictation Number [id number]  PLAN OF CARE: Admit for overnight observation  PATIENT DISPOSITION:  PACU - hemodynamically stable.   Delay start of Pharmacological VTE agent (>24hrs) due to surgical blood loss or risk of bleeding: not applicable

## 2020-01-01 NOTE — Interval H&P Note (Signed)
History and Physical Interval Note:  01/01/2020 2:59 PM  Travis Simmons  has presented today for surgery, with the diagnosis of Right shoulder osteoarthritis rotator cuff insufficiency.  The various methods of treatment have been discussed with the patient and family. After consideration of risks, benefits and other options for treatment, the patient has consented to  Procedure(s) with comments: REVERSE SHOULDER ARTHROPLASTY (Right) - interscalene block as a surgical intervention.  The patient's history has been reviewed, patient examined, no change in status, stable for surgery.  I have reviewed the patient's chart and labs.  Questions were answered to the patient's satisfaction.     Verlee Rossetti

## 2020-01-01 NOTE — Anesthesia Procedure Notes (Signed)
Anesthesia Procedure Image    

## 2020-01-01 NOTE — Anesthesia Procedure Notes (Signed)
Anesthesia Regional Block: Interscalene brachial plexus block   Pre-Anesthetic Checklist: ,, timeout performed, Correct Patient, Correct Site, Correct Laterality, Correct Procedure, Correct Position, site marked, Risks and benefits discussed,  Surgical consent,  Pre-op evaluation,  At surgeon's request and post-op pain management  Laterality: Right  Prep: chloraprep       Needles:  Injection technique: Single-shot  Needle Type: Echogenic Needle     Needle Length: 9cm      Additional Needles:   Procedures:,,,, ultrasound used (permanent image in chart),,,,  Narrative:  Start time: 01/01/2020 2:30 PM End time: 01/01/2020 2:38 PM Injection made incrementally with aspirations every 5 mL.  Performed by: Personally  Anesthesiologist: Eilene Ghazi, MD  Additional Notes: Patient tolerated the procedure well without complications

## 2020-01-02 DIAGNOSIS — M19011 Primary osteoarthritis, right shoulder: Secondary | ICD-10-CM | POA: Diagnosis not present

## 2020-01-02 LAB — BASIC METABOLIC PANEL
Anion gap: 12 (ref 5–15)
BUN: 15 mg/dL (ref 8–23)
CO2: 22 mmol/L (ref 22–32)
Calcium: 8.4 mg/dL — ABNORMAL LOW (ref 8.9–10.3)
Chloride: 102 mmol/L (ref 98–111)
Creatinine, Ser: 0.75 mg/dL (ref 0.61–1.24)
GFR, Estimated: >60 mL/min (ref >60)
Glucose, Bld: 178 mg/dL — ABNORMAL HIGH (ref 70–99)
Potassium: 4.4 mmol/L (ref 3.5–5.1)
Sodium: 136 mmol/L (ref 135–145)

## 2020-01-02 LAB — HEMOGLOBIN AND HEMATOCRIT, BLOOD
HCT: 41.1 % (ref 39.0–52.0)
Hemoglobin: 14.1 g/dL (ref 13.0–17.0)

## 2020-01-02 NOTE — Op Note (Signed)
NAMENEMESIO, Travis Simmons MEDICAL RECORD FI:[id number] ACCOUNT [id number] DATE OF BIRTH:26-Nov-1955 FACILITY: [facility] LOCATION: [facility] PHYSICIAN:STEVEN R. Adah Stoneberg, MD  OPERATIVE REPORT  DATE OF PROCEDURE:  01/01/2020  PREOPERATIVE DIAGNOSIS:  Right shoulder end-stage arthritis with rotator cuff insufficiency.  POSTOPERATIVE DIAGNOSIS:  Right shoulder end-stage arthritis with rotator cuff insufficiency.  PROCEDURE PERFORMED:  Right reverse shoulder replacement using Arthrex reverse shoulder system with no subscapularis repair.  ATTENDING SURGEON:  Malon Kindle, MD  CO-SURGEON:  Duwayne Heck, MD  ASSISTANT:  Modesto Charon, [place]  ANESTHESIA:  General anesthesia was used plus interscalene block.  ESTIMATED BLOOD LOSS:  150 mL.  FLUID REPLACEMENT:  1500 mL crystalloid.  INSTRUMENT COUNTS:  Correct.  COMPLICATIONS:  No complications.  ANTIBIOTICS:  Perioperative antibiotics were given.  INDICATIONS:  The patient is a 64 year old male who presents with a history of worsening right shoulder pain and dysfunction secondary to end-stage arthritis of the right shoulder.  The patient had severe glenoid bone loss including posterior superior  wear creating a B2 glenoid situation.  The patient also has rotator cuff insufficiency with poor mechanics.  We counseled the patient regarding options for management recommending reverse shoulder replacement with an augmented glenoid baseplate.  The  patient agreed with this plan.  Preoperative planning was performed, and Dr. Duwayne Heck agreed to be a co-surgeon on this case due to complexity of surgery.  Risks including, but not limited to infection and instability were discussed with the patient.   Informed consent was obtained.  DESCRIPTION OF PROCEDURE:  After adequate level of general anesthesia was achieved plus interscalene block, the patient was positioned in modified beach chair position.  Right shoulder was correctly identified and sterilely  prepped and draped in the  usual manner.  Timeout was called verifying correct patient, correct site.  We utilized a standard deltopectoral incision started at the coracoid process extending down to the anterior humerus, dissection down through subcutaneous tissues using Bovie  electrocautery.  Cephalic vein was identified, taken laterally with the deltoid pectoralis, taken medially.  Conjoined tendon was identified and retracted medially.  Deep retractors were placed.  The biceps was tenodesed in situ with 0 Vicryl  figure-of-eight suture x2 incorporating part of the pec tendon.  We then released the subscap remnant, which was not going to be repairable.  We did go ahead and tag it for protection of the axillary nerve.  We released the inferior capsule progressively  externally rotating.  We then released the biceps at the joint line and also a little bit of supraspinatus and infraspinatus tendon.  We then placed a Browne retractor exposing the proximal humerus.  We used a 135 guide for cutting in 30 degrees of  retroversion and then made our cut with an oscillating saw.  We then used a rongeur to remove excess osteophytes off the humerus.  We subluxed the humerus posteriorly.  We then went ahead and did a capsule labral excision protecting the axillary nerve.   Once we were able to gain adequate exposure of the glenoid, we noted there to be severe posterior superior glenoid bone loss.  We used our targeting guide, set appropriately off of our preoperative CT guidance and placed that over the anterior lip of the  glenoid and then seated the feet posteriorly.  We then placed our guide pin.  We had to do 1 guide pin change to make sure that we were not overly inferiorly tilted, and once we had that guide pin in good position and  bicortical, then we did our  reaming.  We first trialed with the 20-degree augment baseplate and then once we had that trialed appropriately and found that sweet spot we went ahead  and placed a Bovie mark posterior superiorly to mark for our reamer.  We placed our reamer in there  and scuffed up the bone.  Then, when we had our glenoid preparation complete, we drilled out our central peg hole for the 25 central peg.  We went ahead and selected the real 20-degree augmented baseplate and impacted that into appropriate position.  We  had good bone contact all the way around our baseplate and good inferior tilt and appropriate correction of version.  At this point, we did our a 36 screw inferiorly, which had excellent screw purchase.  We had a 30 screw proximally and then a 24 screw  anteriorly and then a 20 screw posteriorly.  So, the anterior and posterior screws were locked.  The superior and inferior screws were press-fit.  We had great baseplate compression and good bony support.  We then did our peripheral hand reaming and then  selected the 39 head.  The baseplate was a +2, so it was a 20-degree +2 and then the head ball was just a neutral head ball and impacted that 39 head ball into position.  We did our set screw to make sure that was secure.  At this point, I did a finger  sweep to make sure there was no soft tissue incorporated in that construct.  We then went up to the humeral side, did our final broaching.  We had started with a size 5 and then broached up to all the way to a size 10.  Then, we trialed off that size 10.   We did our metaphyseal reaming and reamed for the suture cup.  We then constructed the trial on the back table with the 10 body and the 135 suture cup and placed that into the shoulder and that was for the 39.  With that in place, we used the trial  poly and reduced the shoulder.  We were happy with our soft tissue balancing, removed all trial components.  We then assembled the HA coated stem on the back table, size 10 stem with the 39 135-degree metaphysis suture cup, and then once we had that  impacted in position to the appropriate height, we selected the  real poly, which was standard poly depth and impacted that on there.  Once we had that done, we reduced the shoulder.  We were happy with our soft tissue tension and balancing.  No gapping  with inferior pole or external rotation.  We irrigated thoroughly, resected the subscap remnant and then went ahead and repaired deltopectoral interval with 0 Vicryl suture followed by 2-0 Vicryl for subcutaneous closure and 4-0 Monocryl for skin.   Steri-Strips were applied followed by sterile dressing.  The patient tolerated surgery well and was awakened and transported to recovery room in stable condition.  IN/NUANCE  D:01/01/2020 T:01/02/2020 JOB:[id number]

## 2020-01-02 NOTE — Discharge Summary (Signed)
In most cases prophylactic antibiotics for Dental procdeures after total joint surgery are not necessary.  Exceptions are as follows:  1. History of prior total joint infection  2. Severely immunocompromised (Organ Transplant, cancer chemotherapy, Rheumatoid biologic meds such as Humera)  3. Poorly controlled diabetes (A1C &gt; 8.0, blood glucose over 200)  If you have one of these conditions, contact your surgeon for an antibiotic prescription, prior to your dental procedure. Orthopedic Discharge Summary        Physician Discharge Summary  Patient ID: Travis Simmons MRN: [id number] DOB/AGE: [birth year] 64 y.o.  Admit date: 01/01/2020 Discharge date: 01/02/2020   Procedures:  Procedure(s) (LRB): REVERSE SHOULDER ARTHROPLASTY (Right)  Attending Physician:  Dr. Malon Kindle  Admission Diagnoses:   Right shoulder end stage OA  Discharge Diagnoses:  same   Past Medical History:  Diagnosis Date  . Arthritis   . Asthma   . GERD (gastroesophageal reflux disease)    hx of     PCP: [name], MD   Discharged Condition: good  Hospital Course:  Patient underwent the above stated procedure on 01/01/2020. Patient tolerated the procedure well and brought to the recovery room in good condition and subsequently to the floor. Patient had an uncomplicated hospital course and was stable for discharge.   Disposition: Discharge disposition: 01-Home or Self Care      with follow up in 2 weeks    Follow-up Information    Beverely Low, MD. Call in 2 weeks.   Specialty: Orthopedic Surgery Why: [phone] Contact information: [address] [place] [address] [phone]               Dental Antibiotics:  In most cases prophylactic antibiotics for Dental procdeures after total joint surgery are not necessary.  Exceptions are as follows:  1. History of prior total joint infection  2. Severely immunocompromised (Organ Transplant,  cancer chemotherapy, Rheumatoid biologic meds such as Humera)  3. Poorly controlled diabetes (A1C &gt; 8.0, blood glucose over 200)  If you have one of these conditions, contact your surgeon for an antibiotic prescription, prior to your dental procedure.  Discharge Instructions    Call MD / Call 911   Complete by: As directed    If you experience chest pain or shortness of breath, CALL 911 and be transported to the hospital emergency room.  If you develope a fever above 101 F, pus (white drainage) or increased drainage or redness at the wound, or calf pain, call your surgeon's office.   Constipation Prevention   Complete by: As directed    Drink plenty of fluids.  Prune juice may be helpful.  You may use a stool softener, such as Colace (over the counter) 100 mg twice a day.  Use MiraLax (over the counter) for constipation as needed.   Diet - low sodium heart healthy   Complete by: As directed    Increase activity slowly as tolerated   Complete by: As directed       Allergies as of 01/02/2020      Reactions   Naproxen Rash      Medication List    STOP taking these medications   acetaminophen 500 MG tablet Commonly known as: TYLENOL   ibuprofen 200 MG tablet Commonly known as: ADVIL   oxyCODONE 5 MG immediate release tablet Commonly known as: Roxicodone     TAKE these medications   albuterol 108 (90 Base) MCG/ACT inhaler Commonly known as: VENTOLIN HFA Inhale 2  puffs into the lungs every 4 (four) hours as needed for wheezing or shortness of breath.   budesonide-formoterol 160-4.5 MCG/ACT inhaler Commonly known as: SYMBICORT Inhale 2 puffs into the lungs 2 (two) times daily.   meloxicam 15 MG tablet Commonly known as: MOBIC Take 15 mg by mouth daily.   methocarbamol 500 MG tablet Commonly known as: Robaxin Take 1 tablet (500 mg total) by mouth every 8 (eight) hours as needed for muscle spasms. What changed:   when to take this  reasons to take this    ondansetron 4 MG tablet Commonly known as: Zofran Take 1 tablet (4 mg total) by mouth every 8 (eight) hours as needed for nausea, vomiting or refractory nausea / vomiting.   oxyCODONE-acetaminophen 5-325 MG tablet Commonly known as: Percocet Take 1-2 tablets by mouth every 6 (six) hours as needed for moderate pain or severe pain.         Signed: Verlee Rossetti 01/02/2020, 8:15 AM  [facility] is now [employer] [address], [address], [place], [place] [address] Phone: [phone] Facebook  Instagram  [employer]

## 2020-01-02 NOTE — Plan of Care (Signed)

## 2020-01-02 NOTE — Evaluation (Signed)
Occupational Therapy Evaluation Patient Details Name: Travis Simmons MRN: [id number] DOB: 11-12-1955 Today's Date: 01/02/2020    History of Present Illness Patient is a 64 year old man s/p R reverse shoulder arthroplasty.   Clinical Impression   [name] is a 64 year old man s/p reverse shoulder replacement without functional use of right dominant upper extremity secondary to effects of surgery and interscalene block and shoulder precautions. Therapist provided education and instruction to patient and spouse in regards to exercises, precautions, positioning, donning upper extremity clothing and bathing while maintaining shoulder precautions, ice and edema management and donning/doffing sling. Patient and spouse verbalized understanding and demonstrated as needed. Patient needed assistance to don socks and shirt but able to don underwear, pants and shoes while adhering to UE precautions. Patient provided with instruction on compensatory strategies to perform ADLs. Patient to follow up with MD for further therapy needs.      Follow Up Recommendations  Follow surgeon's recommendation for DC plan and follow-up therapies    Equipment Recommendations  None recommended by OT    Recommendations for Other Services       Precautions / Restrictions Precautions Precautions: Shoulder Type of Shoulder Precautions: NWB, AROM of elbow, wrist and hand as tolerated, sling for comfort and sleep, AROM/PROM flexion up to 90 degres, AROM/PROM abduction up to 60 degrees, AROM/PROM external rotation up to 30 degrees Shoulder Interventions: Shoulder sling/immobilizer;Off for dressing/bathing/exercises;For comfort (for sleep) Restrictions Weight Bearing Restrictions: Yes RUE Weight Bearing: Non weight bearing      Mobility Bed Mobility Overal bed mobility: Modified Independent                  Transfers Overall transfer level: Modified independent                    Balance  Overall balance assessment: No apparent balance deficits (not formally assessed)                                         ADL either performed or assessed with clinical judgement   ADL Overall ADL's : Needs assistance/impaired Eating/Feeding: Modified independent   Grooming: Modified independent   Upper Body Bathing: Modified independent   Lower Body Bathing: Modified independent   Upper Body Dressing : Moderate assistance;Adhering to UE precautions Upper Body Dressing Details (indicate cue type and reason): Assstance to don shirt Lower Body Dressing: Minimal assistance Lower Body Dressing Details (indicate cue type and reason): min assist to don socks, patient able to don underwear, pants and shoes. Toilet Transfer: Financial controller and Hygiene: Modified independent               Vision   Vision Assessment?: No apparent visual deficits     Perception     Praxis      Pertinent Vitals/Pain Pain Assessment: No/denies pain     Hand Dominance     Extremity/Trunk Assessment Upper Extremity Assessment Upper Extremity Assessment: RUE deficits/detail;LUE deficits/detail RUE Deficits / Details: Decreased AROM/strength secondary to shoulder precautions and effects of interscalene block LUE Deficits / Details: WFL ROM and strength           Communication     Cognition Arousal/Alertness: Awake/alert Behavior During Therapy: WFL for tasks assessed/performed Overall Cognitive Status: Within Functional Limits for tasks assessed  General Comments       Exercises     Shoulder Instructions Shoulder Instructions Donning/doffing shirt without moving shoulder: Moderate assistance;Patient able to independently direct caregiver;Caregiver independent with task Method for sponge bathing under operated UE: Independent Donning/doffing sling/immobilizer: Moderate  assistance;Patient able to independently direct caregiver;Caregiver independent with task Correct positioning of sling/immobilizer: Independent ROM for elbow, wrist and digits of operated UE: Independent Sling wearing schedule (on at all times/off for ADL's): Independent Proper positioning of operated UE when showering: Independent Dressing change: Independent    Home Living Family/patient expects to be discharged to:: Private residence Living Arrangements: Spouse/significant other Available Help at Discharge: Available 24 hours/day                                    Prior Functioning/Environment                   OT Problem List: Decreased range of motion;Decreased strength;Impaired UE functional use;Pain      OT Treatment/Interventions:      OT Goals(Current goals can be found in the care plan section) Acute Rehab OT Goals Patient Stated Goal: To golf OT Goal Formulation: All assessment and education complete, DC therapy  OT Frequency:     Barriers to D/C:            Co-evaluation              AM-PAC OT "6 Clicks" Daily Activity     Outcome Measure Help from another person eating meals?: None Help from another person taking care of personal grooming?: None Help from another person toileting, which includes using toliet, bedpan, or urinal?: None Help from another person bathing (including washing, rinsing, drying)?: None Help from another person to put on and taking off regular upper body clothing?: A Lot Help from another person to put on and taking off regular lower body clothing?: A Little 6 Click Score: 21   End of Session Equipment Utilized During Treatment:  (sling) Nurse Communication:  (OT education complete)  Activity Tolerance: Patient tolerated treatment well Patient left: with family/visitor present (at side of bed)  OT Visit Diagnosis: Muscle weakness (generalized) (M62.81)                Time: 8099-8338 OT Time Calculation  (min): 15 min Charges:  OT General Charges $OT Visit: 1 Visit OT Evaluation $OT Eval Low Complexity: 1 Low  Aleynah Rocchio, OTR/L Acute Care Rehab Services  Office [phone] Pager: [phone]   Kelli Churn 01/02/2020, 9:07 AM

## 2020-01-02 NOTE — Progress Notes (Signed)
Orthopedics Progress Note  Subjective: Patient is comfortable this AM, block still working  Objective:  Vitals:   01/02/20 0438 01/02/20 0741  BP: 140/72   Pulse: 66   Resp: 15   Temp: 97.9 F (36.6 C)   SpO2: 98% 92%    General: Awake and alert  Musculoskeletal: Right shoulder dressing changed, minimal swelling and no drainage Neurovascularly intact  Lab Results  Component Value Date   WBC 4.7 12/24/2019   HGB 14.1 01/02/2020   HCT 41.1 01/02/2020   MCV 94.3 12/24/2019   PLT 205 12/24/2019       Component Value Date/Time   NA 136 01/02/2020 0334   K 4.4 01/02/2020 0334   CL 102 01/02/2020 0334   CO2 22 01/02/2020 0334   GLUCOSE 178 (H) 01/02/2020 0334   BUN 15 01/02/2020 0334   CREATININE 0.75 01/02/2020 0334   CALCIUM 8.4 (L) 01/02/2020 0334   GFRNONAA >60 01/02/2020 0334   GFRAA >60 03/02/2017 0610    No results found for: INR, PROTIME  Assessment/Plan: POD #1 s/p Procedure(s): REVERSE SHOULDER ARTHROPLASTY OT this AM, active protocol, AAROM and ADLs ok Discharge to home after therapy, follow up in two weeks in the office  Viviann Spare R. Ranell Patrick, MD 01/02/2020 8:13 AM

## 2020-01-02 NOTE — Plan of Care (Signed)
  Problem: Education: Goal: Knowledge of General Education information will improve Description: Including pain rating scale, medication(s)/side effects and non-pharmacologic comfort measures 01/02/2020 0908 by Iantha Fallen, RN Outcome: Adequate for Discharge 01/02/2020 0907 by Iantha Fallen, RN Outcome: Progressing   Problem: Health Behavior/Discharge Planning: Goal: Ability to manage health-related needs will improve 01/02/2020 0908 by Iantha Fallen, RN Outcome: Adequate for Discharge 01/02/2020 0907 by Iantha Fallen, RN Outcome: Progressing   Problem: Clinical Measurements: Goal: Ability to maintain clinical measurements within normal limits will improve 01/02/2020 0908 by Iantha Fallen, RN Outcome: Adequate for Discharge 01/02/2020 0907 by Iantha Fallen, RN Outcome: Progressing Goal: Will remain free from infection 01/02/2020 0908 by Iantha Fallen, RN Outcome: Adequate for Discharge 01/02/2020 0907 by Iantha Fallen, RN Outcome: Progressing Goal: Diagnostic test results will improve 01/02/2020 0908 by Iantha Fallen, RN Outcome: Adequate for Discharge 01/02/2020 0907 by Iantha Fallen, RN Outcome: Progressing Goal: Respiratory complications will improve 01/02/2020 0908 by Iantha Fallen, RN Outcome: Adequate for Discharge 01/02/2020 0907 by Iantha Fallen, RN Outcome: Progressing Goal: Cardiovascular complication will be avoided 01/02/2020 0908 by Iantha Fallen, RN Outcome: Adequate for Discharge 01/02/2020 0907 by Iantha Fallen, RN Outcome: Progressing   Problem: Activity: Goal: Risk for activity intolerance will decrease 01/02/2020 0908 by Iantha Fallen, RN Outcome: Adequate for Discharge 01/02/2020 0907 by Iantha Fallen, RN Outcome: Progressing   Problem: Nutrition: Goal: Adequate nutrition will be maintained 01/02/2020 0908 by Iantha Fallen, RN Outcome: Adequate for Discharge 01/02/2020 0907 by Iantha Fallen, RN Outcome:  Progressing   Problem: Coping: Goal: Level of anxiety will decrease 01/02/2020 0908 by Iantha Fallen, RN Outcome: Adequate for Discharge 01/02/2020 0907 by Iantha Fallen, RN Outcome: Progressing   Problem: Elimination: Goal: Will not experience complications related to bowel motility 01/02/2020 0908 by Iantha Fallen, RN Outcome: Adequate for Discharge 01/02/2020 0907 by Iantha Fallen, RN Outcome: Progressing Goal: Will not experience complications related to urinary retention 01/02/2020 0908 by Iantha Fallen, RN Outcome: Adequate for Discharge 01/02/2020 0907 by Iantha Fallen, RN Outcome: Progressing   Problem: Pain Managment: Goal: General experience of comfort will improve 01/02/2020 0908 by Iantha Fallen, RN Outcome: Adequate for Discharge 01/02/2020 0907 by Iantha Fallen, RN Outcome: Progressing   Problem: Safety: Goal: Ability to remain free from injury will improve 01/02/2020 0908 by Iantha Fallen, RN Outcome: Adequate for Discharge 01/02/2020 0907 by Iantha Fallen, RN Outcome: Progressing   Problem: Skin Integrity: Goal: Risk for impaired skin integrity will decrease 01/02/2020 0908 by Iantha Fallen, RN Outcome: Adequate for Discharge 01/02/2020 0907 by Iantha Fallen, RN Outcome: Progressing

## 2020-01-06 ENCOUNTER — Encounter (HOSPITAL_COMMUNITY): Payer: Self-pay | Admitting: Orthopedic Surgery

## 2020-01-19 DIAGNOSIS — Z4789 Encounter for other orthopedic aftercare: Secondary | ICD-10-CM | POA: Diagnosis not present

## 2020-02-16 DIAGNOSIS — Z4789 Encounter for other orthopedic aftercare: Secondary | ICD-10-CM | POA: Diagnosis not present

## 2020-08-19 DIAGNOSIS — R252 Cramp and spasm: Secondary | ICD-10-CM | POA: Diagnosis not present

## 2020-08-19 DIAGNOSIS — Z7689 Persons encountering health services in other specified circumstances: Secondary | ICD-10-CM | POA: Diagnosis not present

## 2020-08-23 DIAGNOSIS — Z4789 Encounter for other orthopedic aftercare: Secondary | ICD-10-CM | POA: Diagnosis not present

## 2020-10-06 DIAGNOSIS — Z23 Encounter for immunization: Secondary | ICD-10-CM | POA: Diagnosis not present

## 2020-10-06 DIAGNOSIS — R030 Elevated blood-pressure reading, without diagnosis of hypertension: Secondary | ICD-10-CM | POA: Diagnosis not present

## 2020-10-06 DIAGNOSIS — M19041 Primary osteoarthritis, right hand: Secondary | ICD-10-CM | POA: Diagnosis not present

## 2020-10-06 DIAGNOSIS — J45909 Unspecified asthma, uncomplicated: Secondary | ICD-10-CM | POA: Diagnosis not present

## 2020-10-06 DIAGNOSIS — Z0000 Encounter for general adult medical examination without abnormal findings: Secondary | ICD-10-CM | POA: Diagnosis not present

## 2020-10-13 DIAGNOSIS — Z23 Encounter for immunization: Secondary | ICD-10-CM | POA: Diagnosis not present

## 2021-04-24 DIAGNOSIS — M25562 Pain in left knee: Secondary | ICD-10-CM | POA: Diagnosis not present

## 2021-06-13 DIAGNOSIS — R202 Paresthesia of skin: Secondary | ICD-10-CM | POA: Diagnosis not present

## 2021-06-13 DIAGNOSIS — I700 Atherosclerosis of aorta: Secondary | ICD-10-CM | POA: Diagnosis not present

## 2021-06-13 DIAGNOSIS — R7303 Prediabetes: Secondary | ICD-10-CM | POA: Diagnosis not present

## 2021-06-13 DIAGNOSIS — R0609 Other forms of dyspnea: Secondary | ICD-10-CM | POA: Diagnosis not present

## 2021-06-13 DIAGNOSIS — R030 Elevated blood-pressure reading, without diagnosis of hypertension: Secondary | ICD-10-CM | POA: Diagnosis not present

## 2021-06-22 ENCOUNTER — Encounter: Payer: Self-pay | Admitting: Internal Medicine

## 2021-06-22 ENCOUNTER — Ambulatory Visit: Payer: Medicare HMO | Admitting: Internal Medicine

## 2021-06-22 VITALS — BP 124/70 | HR 64 | Ht 71.0 in | Wt 217.4 lb

## 2021-06-22 DIAGNOSIS — R072 Precordial pain: Secondary | ICD-10-CM | POA: Diagnosis not present

## 2021-06-22 DIAGNOSIS — I700 Atherosclerosis of aorta: Secondary | ICD-10-CM | POA: Diagnosis not present

## 2021-06-22 DIAGNOSIS — R0600 Dyspnea, unspecified: Secondary | ICD-10-CM

## 2021-06-22 DIAGNOSIS — Z01812 Encounter for preprocedural laboratory examination: Secondary | ICD-10-CM | POA: Diagnosis not present

## 2021-06-22 MED ORDER — ATORVASTATIN CALCIUM 40 MG PO TABS: 40.0000 mg | ORAL_TABLET | Freq: Every day | ORAL | 3 refills | Status: DC

## 2021-06-22 MED ORDER — ASPIRIN EC 81 MG PO TBEC: 81.0000 mg | DELAYED_RELEASE_TABLET | Freq: Every day | ORAL | 3 refills | Status: DC

## 2021-06-22 NOTE — Progress Notes (Signed)
?Cardiology Office Note:   ? ?Date:  06/22/2021  ? ?ID:  Travis Simmons, DOB 09/03/1955, MRN [id number] ? ?PCP:  [name], MD  ? ?[facility] Providers ?Cardiologist:  Alverda Skeans, MD ?Referring MD: Sheliah Hatch, PA-C  ? ?Chief Complaint/Reason for Referral: Dyspnea on exertion, chest tightness ? ?ASSESSMENT:   ? ?1. Precordial pain   ?2. Dyspnea, unspecified type   ?3. Aortic atherosclerosis (HCC)   ?4. Pre-procedure lab exam   ? ? ?PLAN:   ? ?In order of problems listed above: ?1.  Chest pain:  We will obtain a coronary CTA and echocardiogram to evaluate further.  If the patient has mild obstructive coronary artery disease, they will require a statin (with goal LDL < 70) and aspirin, if they have high-grade disease we will need to consider optimal medical therapy and if symptoms are refractory to medical therapy, then a cardiac catheterization with possible PCI will be pursued to alleviate symptoms.  If they have high risk disease we will proceed directly to cardiac catheterization.  Follow up  in 9 months. ?2.  Dyspnea: We will obtain echocardiogram to evaluate further. ?3.  Aortic atherosclerosis: Start aspirin 81 mg daily and atorvastatin 40 mg daily for goal LDL less than 70.  LDL recently was 149.  Check lipid panel, LP(a), and LFTs in 2 months. ? ? ?     ? ?   ? ?Dispo:  Return in about 9 months (around 03/25/2022).  ? ?  ? ?Medication Adjustments/Labs and Tests Ordered: ?Current medicines are reviewed at length with the patient today.  Concerns regarding medicines are outlined above. ? ?The following changes have been made:    ? ?Labs/tests ordered: ?Orders Placed This Encounter  ?Procedures  ? CT CORONARY MORPH W/CTA COR W/SCORE W/CA W/CM &/OR WO/CM  ? Hepatic function panel  ? Lipoprotein A (LPA)  ? Lipid panel  ? Basic Metabolic Panel (BMET)  ? EKG 12-Lead  ? ECHOCARDIOGRAM COMPLETE  ? ? ?Medication Changes: ?Meds ordered this encounter  ?Medications  ? aspirin EC 81 MG tablet  ?  Sig:  Take 1 tablet (81 mg total) by mouth daily. Swallow whole.  ?  Dispense:  90 tablet  ?  Refill:  3  ? atorvastatin (LIPITOR) 40 MG tablet  ?  Sig: Take 1 tablet (40 mg total) by mouth daily.  ?  Dispense:  90 tablet  ?  Refill:  3  ? ? ? ?Current medicines are reviewed at length with the patient today.  The patient does not have concerns regarding medicines. ? ? ?History of Present Illness:   ? ?FOCUSED PROBLEM LIST:   ?1.  Asthma ?2.  Aortic atherosclerosis on CT 2021 ?3.  GERD ?4.  FH of coronary artery disease ?5.  Prior 75 p-y history ? ?The patient is a 66 y.o. male with the indicated medical history here for recommendations regarding dyspnea on exertion and chest tightness.  The patient was seen by his primary care provider recently for these complaints.  The patient is concerned because these are the same symptoms that led to his brother finding out he needed bypass surgery.  He denies any overt chest pain.  He has had no presyncope, syncope, severe palpitations, paroxysmal nocturnal dyspnea, orthopnea.  He had no severe bleeding or bruising.  He does have asthma but it seems to be relatively well controlled on Symbicort.  He is required no emergency room visits or hospitalizations. ? ?   ?  ? ? ?  Current Medications: ?Current Meds  ?Medication Sig  ? albuterol (PROVENTIL HFA;VENTOLIN HFA) 108 (90 Base) MCG/ACT inhaler Inhale 2 puffs into the lungs every 4 (four) hours as needed for wheezing or shortness of breath.  ? aspirin EC 81 MG tablet Take 1 tablet (81 mg total) by mouth daily. Swallow whole.  ? atorvastatin (LIPITOR) 40 MG tablet Take 1 tablet (40 mg total) by mouth daily.  ? budesonide-formoterol (SYMBICORT) 160-4.5 MCG/ACT inhaler Inhale 2 puffs into the lungs 2 (two) times daily.  ? meloxicam (MOBIC) 15 MG tablet Take 15 mg by mouth daily.  ? methocarbamol (ROBAXIN) 500 MG tablet Take 1 tablet (500 mg total) by mouth every 8 (eight) hours as needed for muscle spasms.  ?  ? ?Allergies:    ?Naproxen   ? ?Social History:   ?Social History  ? ?Tobacco Use  ? Smoking status: Former  ? Smokeless tobacco: Never  ? Tobacco comments:  ?  QUIT IN 2004  ?Vaping Use  ? Vaping Use: Never used  ?Substance Use Topics  ? Alcohol use: No  ?  Comment: no etoh in 18 years   ? Drug use: No  ?  ? ?Family Hx: ?History reviewed. No pertinent family history.  ? ?Review of Systems:   ?Please see the history of present illness.    ?All other systems reviewed and are negative. ?  ? ? ?EKGs/Labs/Other Test Reviewed:   ? ?EKG: EKG performed 2018 demonstrates sinus bradycardia.  EKG performed today that I personally reviewed demonstrates normal sinus rhythm with sinus arrhythmia. ? ?Prior CV studies: ?None available ? ?Other studies Reviewed: ?Review of the additional studies/records demonstrates: CT shoulder 2021 demonstrates aortic atherosclerosis ? ?Recent Labs: ?External labs demonstrate hemoglobin A1c of 6.4, LDL of 149, creatinine of 3.53, LFTs within normal limits, hemoglobin of 15.1 ? ?Risk Assessment/Calculations:   ? ? ?    ? ?Physical Exam:   ? ?VS:  BP 124/70   Pulse 64   Ht 5\' 11"  (1.803 m)   Wt 217 lb 6.4 oz (98.6 kg)   SpO2 96%   BMI 30.32 kg/m?    ?Wt Readings from Last 3 Encounters:  ?06/22/21 217 lb 6.4 oz (98.6 kg)  ?01/01/20 211 lb 12.8 oz (96.1 kg)  ?03/01/17 224 lb 14.4 oz (102 kg)  ?  ?GENERAL:  No apparent distress, AOx3 ?HEENT:  No carotid bruits, +2 carotid impulses, no scleral icterus ?CAR: RRR no murmurs, gallops, rubs, or thrills ?RES:  Clear to auscultation bilaterally ?ABD:  Soft, nontender, nondistended, positive bowel sounds x 4 ?VASC:  +2 radial pulses, +2 carotid pulses, palpable pedal pulses ?NEURO:  CN 2-12 grossly intact; motor and sensory grossly intact ?PSYCH:  No active depression or anxiety ?EXT:  No edema, ecchymosis, or cyanosis ? ?Signed, ?03/03/17, MD  ?06/22/2021 1:48 PM    ?[facility] ?[address] [place], [place], [place]  [place] ?Phone: [phone]; Fax:  [phone]  ? ?Note:  This document was prepared using Dragon voice recognition software and may include unintentional dictation errors. ?

## 2021-06-22 NOTE — Patient Instructions (Signed)
Medication Instructions:  ?Your physician has recommended you make the following change in your medication:  ?1.) start aspirin 81 mg - one tablet daily ?2.) start atorvastatin (Lipitor) 40 mg - one tablet daily ? ?*If you need a refill on your cardiac medications before your next appointment, please call your pharmacy* ? ? ?Lab Work: ?Today: BMET and ?Return in 2 months for Lipids/Liver/Lp(a) ? ?If you have labs (blood work) drawn today and your tests are completely normal, you will receive your results only by: ?MyChart Message (if you have MyChart) OR ?A paper copy in the mail ?If you have any lab test that is abnormal or we need to change your treatment, we will call you to review the results. ? ? ?Testing/Procedures: ?Your physician has requested that you have an echocardiogram. Echocardiography is a painless test that uses sound waves to create images of your heart. It provides your doctor with information about the size and shape of your heart and how well your heart?s chambers and valves are working. This procedure takes approximately one hour. There are no restrictions for this procedure. ? ?Cardiac CTA - see instructions below ? ? ?Follow-Up: ?At [facility], you and your health needs are our priority.  As part of our continuing mission to provide you with exceptional heart care, we have created designated Provider Care Teams.  These Care Teams include your primary Cardiologist (physician) and Advanced Practice Providers (APPs -  Physician Assistants and Nurse Practitioners) who all work together to provide you with the care you need, when you need it. ? ? ?Your next appointment:   ?9 month(s) ? ?The format for your next appointment:   ?In Person ? ?Provider:   ?Early Osmond, MD { ? ? ?Other Instructions ? ? ?Your cardiac CT will be scheduled at [facility] ?[address] ?[place], [place] [address] ?[phone] ?Please arrive at the [facility] and Children's Entrance (Entrance C2) of  [facility] 30 minutes prior to test start time. ?You can use the FREE valet parking offered at entrance C (encouraged to control the heart rate for the test)  ?Proceed to the [facility] Radiology Department (first floor) to check-in and test prep. ? ?All radiology patients and guests should use entrance C2 at [facility], accessed from [facility], even though the hospital's physical address listed is [address] ? ? ? ?Please follow these instructions carefully (unless otherwise directed): ? ?Hold all erectile dysfunction medications at least 3 days (72 hrs) prior to test. ? ?On the Night Before the Test: ?Be sure to Drink plenty of water. ?Do not consume any caffeinated/decaffeinated beverages or chocolate 12 hours prior to your test. ?Do not take any antihistamines 12 hours prior to your test. ? ?On the Day of the Test: ?Drink plenty of water until 1 hour prior to the test. ?Do not eat any food 4 hours prior to the test. ?You may take your regular medications prior to the test.  ?     ?After the Test: ?Drink plenty of water. ?After receiving IV contrast, you may experience a mild flushed feeling. This is normal. ?On occasion, you may experience a mild rash up to 24 hours after the test. This is not dangerous. If this occurs, you can take Benadryl 25 mg and increase your fluid intake. ?If you experience trouble breathing, this can be serious. If it is severe call 911 IMMEDIATELY. If it is mild, please call our office. ?If you  take any of these medications: Glipizide/Metformin, Avandament, Glucavance, please do not take 48 hours after completing test unless otherwise instructed. ? ?We will call to schedule your test 2-4 weeks out understanding that some insurance companies will need an authorization prior to the service being performed.  ? ?For non-scheduling related questions, please contact the cardiac imaging nurse navigator should you have any questions/concerns: ?Marchia Bond, Cardiac Imaging Nurse Navigator ?Gordy Clement, Cardiac Imaging Nurse Navigator ?[facility] ?Direct Office Dial: [phone]  ? ?For scheduling needs, including cancellations and rescheduling, please call [place], [phone]. ? ? ?Important Information About Sugar ? ? ? ? ? ?

## 2021-06-23 LAB — BASIC METABOLIC PANEL
BUN/Creatinine Ratio: 16 (ref 10–24)
BUN: 14 mg/dL (ref 8–27)
CO2: 23 mmol/L (ref 20–29)
Calcium: 9.5 mg/dL (ref 8.6–10.2)
Chloride: 102 mmol/L (ref 96–106)
Creatinine, Ser: 0.87 mg/dL (ref 0.76–1.27)
Glucose: 128 mg/dL — ABNORMAL HIGH (ref 70–99)
Potassium: 4.2 mmol/L (ref 3.5–5.2)
Sodium: 141 mmol/L (ref 134–144)
eGFR: 95 mL/min/{1.73_m2} (ref >59)

## 2021-07-11 ENCOUNTER — Ambulatory Visit (HOSPITAL_COMMUNITY): Payer: Medicare HMO | Attending: Cardiovascular Disease

## 2021-07-11 DIAGNOSIS — R0600 Dyspnea, unspecified: Secondary | ICD-10-CM | POA: Diagnosis not present

## 2021-07-11 DIAGNOSIS — I700 Atherosclerosis of aorta: Secondary | ICD-10-CM

## 2021-07-11 DIAGNOSIS — R072 Precordial pain: Secondary | ICD-10-CM | POA: Diagnosis not present

## 2021-07-11 LAB — ECHOCARDIOGRAM COMPLETE
AR max vel: 2.75 cm2
AV Area VTI: 2.52 cm2
AV Area mean vel: 2.76 cm2
AV Mean grad: 7 mmHg
AV Peak grad: 13.8 mmHg
Ao pk vel: 1.86 m/s
Area-P 1/2: 3.39 cm2
S' Lateral: 2.8 cm

## 2021-07-12 ENCOUNTER — Telehealth (HOSPITAL_COMMUNITY): Payer: Self-pay | Admitting: Emergency Medicine

## 2021-07-12 NOTE — Telephone Encounter (Signed)
Reaching out to patient to offer assistance regarding upcoming cardiac imaging study; pt verbalizes understanding of appt date/time, parking situation and where to check in, pre-test NPO status and medications ordered, and verified current allergies; name and call back number provided for further questions should they arise Travis Alexandria RN Navigator Cardiac Imaging [facility] [phone] office [phone] cell  Denies iv issues Daily meds Arrival 900

## 2021-07-13 ENCOUNTER — Encounter (HOSPITAL_COMMUNITY): Payer: Self-pay

## 2021-07-13 ENCOUNTER — Ambulatory Visit (HOSPITAL_BASED_OUTPATIENT_CLINIC_OR_DEPARTMENT_OTHER)
Admission: RE | Admit: 2021-07-13 | Discharge: 2021-07-13 | Disposition: A | Discharge status: Home / Self Care | Payer: Medicare HMO | Source: Ambulatory Visit | Attending: Cardiovascular Disease | Admitting: Cardiovascular Disease

## 2021-07-13 ENCOUNTER — Other Ambulatory Visit: Payer: Self-pay | Admitting: Cardiovascular Disease

## 2021-07-13 ENCOUNTER — Ambulatory Visit (HOSPITAL_COMMUNITY)
Admission: RE | Admit: 2021-07-13 | Discharge: 2021-07-13 | Disposition: A | Discharge status: Home / Self Care | Payer: Medicare HMO | Source: Ambulatory Visit | Attending: Internal Medicine | Admitting: Internal Medicine

## 2021-07-13 ENCOUNTER — Ambulatory Visit (HOSPITAL_COMMUNITY)
Admission: RE | Admit: 2021-07-13 | Discharge: 2021-07-13 | Disposition: A | Discharge status: Home / Self Care | Payer: Medicare HMO | Source: Ambulatory Visit | Attending: Cardiovascular Disease | Admitting: Cardiovascular Disease

## 2021-07-13 DIAGNOSIS — I2510 Atherosclerotic heart disease of native coronary artery without angina pectoris: Secondary | ICD-10-CM

## 2021-07-13 DIAGNOSIS — R072 Precordial pain: Secondary | ICD-10-CM | POA: Insufficient documentation

## 2021-07-13 DIAGNOSIS — R931 Abnormal findings on diagnostic imaging of heart and coronary circulation: Secondary | ICD-10-CM

## 2021-07-13 MED ORDER — NITROGLYCERIN 0.4 MG SL SUBL
SUBLINGUAL_TABLET | SUBLINGUAL | Status: AC
  Filled 2021-07-13: qty 2

## 2021-07-13 MED ORDER — NITROGLYCERIN 0.4 MG SL SUBL: 0.8000 mg | SUBLINGUAL_TABLET | Freq: Once | SUBLINGUAL | Status: DC

## 2021-07-13 MED ORDER — IOHEXOL 350 MG/ML SOLN
100.0000 mL | Freq: Once | INTRAVENOUS | Status: AC | PRN
  Administered 2021-07-13: 100 mL via INTRAVENOUS

## 2021-07-13 NOTE — Progress Notes (Signed)
CT FFR ordered.   [place] T. Audie Box, MD, [place]  [address], [place], [place] [address] [phone]  11:38 AM

## 2021-07-14 ENCOUNTER — Encounter: Payer: Self-pay | Admitting: *Deleted

## 2021-07-14 ENCOUNTER — Other Ambulatory Visit: Payer: Self-pay | Admitting: *Deleted

## 2021-07-14 MED ORDER — METOPROLOL SUCCINATE ER 25 MG PO TB24: 12.5000 mg | ORAL_TABLET | Freq: Every day | ORAL | 3 refills | Status: DC

## 2021-07-14 MED ORDER — NITROGLYCERIN 0.4 MG SL SUBL
0.4000 mg | SUBLINGUAL_TABLET | SUBLINGUAL | 3 refills | Status: DC | PRN
Start: 2021-07-14 — End: 2023-01-23

## 2021-07-14 MED ORDER — ISOSORBIDE MONONITRATE ER 30 MG PO TB24: 30.0000 mg | ORAL_TABLET | Freq: Every day | ORAL | 3 refills | Status: DC

## 2021-07-14 NOTE — Telephone Encounter (Signed)
Spoke w patient.  Reviewed results and recommendations.  Pt already taking asa and atorvastatin.  Will pick up and begin IMDUR, Toprol XL and prn ntg.  Instructed on ntg use.  The pt has not had any further episodes of chest pain since before his visit in cardiology last month.  The pt prefers to wait for Dr. Lynnette Caffey since he is asymptomatic at this time.  We will plan for left heart cath on 08/02/21, 8:30 am, with office visit on 07/31/21 to update H&P, EKG and blood work.  Pt understands he will receive cath instructions on this day.

## 2021-07-14 NOTE — Telephone Encounter (Addendum)
-----   Message from Early Osmond, MD sent at 07/13/2021  1:29 PM EDT ----- Please let PCP know to follow pulmonary nodule.  Early Osmond, MD  Rodman Key, RN Lets start him on ASA 81, atorva 40, Imdur 30 at bedtime, Toprol 12.5mg  at bedtime, and PRN NTG.  He has a significant blockage in the proximal LAD (prognostically important) so let's set him up for a cath. Since I'm gone for a few weeks, can be with anyone.  Otherwise, he can wait for me to do it if he's not having a lot of chest pain or dyspnea.

## 2021-07-31 ENCOUNTER — Ambulatory Visit: Payer: Medicare HMO | Admitting: Physician Assistant

## 2021-07-31 ENCOUNTER — Encounter: Payer: Self-pay | Admitting: Physician Assistant

## 2021-07-31 VITALS — BP 132/84 | HR 56 | Ht 71.0 in | Wt 214.9 lb

## 2021-07-31 DIAGNOSIS — E785 Hyperlipidemia, unspecified: Secondary | ICD-10-CM | POA: Diagnosis not present

## 2021-07-31 DIAGNOSIS — I25118 Atherosclerotic heart disease of native coronary artery with other forms of angina pectoris: Secondary | ICD-10-CM | POA: Diagnosis not present

## 2021-07-31 NOTE — Progress Notes (Unsigned)
Cardiology Office Note:    Date:  08/01/2021   ID:  Travis Simmons May 07, 1955, MRN [id number]  PCP:  Orpah Melter, MD   [facility] Providers Cardiologist:  Early Osmond, MD     Referring MD: Orpah Melter, MD   Chief Complaint  Patient presents with   Pre-op Exam   Follow-up    Abnormal coronary CT    History of Present Illness:    Travis Simmons is a 66 y.o. male with a hx of asthma, aortic atherosclerosis on previous CT, GERD and prediabetes who was recently seen on 06/22/2021 for chest tightness and dyspnea on exertion.  He has family history of CAD as well.  His brother has a history of bypass surgery.  Renal function and electrolyte was normal.  Echocardiogram obtained on 07/11/2021 demonstrated EF 65 to 70%, no regional wall motion abnormality, trivial MR.  Coronary CT obtained on 07/13/2021 demonstrated 70 to 99% stenosis in proximal LAD, 25 to 49% mid LAD lesion, less than 25% disease in proximal to mid RCA.  Coronary calcium score 325 which placed the patient on 71st percentile for age and sex matched control.  Patient does have 8 x 5 mm right middle lobe lung nodule, repeat noncontrast CT of chest in 6 to 12 months was recommended.  FFR was positive in proximal LAD at 0.52.  Patient was placed on aspirin, Lipitor, Imdur and Toprol-XL.  Outpatient cardiac catheterization was recommended.  Patient presents today for discussion prior to cardiac catheterization.  I reviewed the recent coronary CT.  Dr. Ali Lowe has asked PCP to follow-up on the pulmonary nodule.  Since his last visit, he has not had any further exertional chest discomfort.  So far he only had 2 episode of exertional chest pain.  The last episode occurred while he was climbing up a incline prior to his last cardiology visit.  EKG obtained today showed no obvious ST-T wave changes.  We will obtain basic metabolic panel and CBC today.  He will undergo cardiac catheterization this Wednesday.  We plan to see him  back in 2 to 4 weeks after the cath.  Risk and benefit of the procedure has been discussed with the patient who is agreeable to proceed.  Past Medical History:  Diagnosis Date   Arthritis    Asthma    Atherosclerosis of aorta (HCC)    GERD (gastroesophageal reflux disease)    hx of    Gout    Obesity    Osteoarthritis    Paresthesia    Pre-diabetes     Past Surgical History:  Procedure Laterality Date   left knee arthroscopy      REVERSE SHOULDER ARTHROPLASTY Right 01/01/2020   Procedure: REVERSE SHOULDER ARTHROPLASTY;  Surgeon: Netta Cedars, MD;  Location: [facility];  Service: Orthopedics;  Laterality: Right;  interscalene block   TOTAL SHOULDER ARTHROPLASTY Left 03/01/2017   TOTAL SHOULDER ARTHROPLASTY Left 03/01/2017   Procedure: LEFT SHOULDER ANATOMIC TOTAL SHOULDER ARTHROPLASTY;  Surgeon: Netta Cedars, MD;  Location: [place];  Service: Orthopedics;  Laterality: Left;    Current Medications: Current Meds  Medication Sig   albuterol (PROVENTIL HFA;VENTOLIN HFA) 108 (90 Base) MCG/ACT inhaler Inhale 2 puffs into the lungs every 4 (four) hours as needed for wheezing or shortness of breath.   aspirin EC 81 MG tablet Take 1 tablet (81 mg total) by mouth daily. Swallow whole.   atorvastatin (LIPITOR) 40 MG tablet Take 1 tablet (40 mg total) by mouth daily.  budesonide-formoterol (SYMBICORT) 160-4.5 MCG/ACT inhaler Inhale 2 puffs into the lungs 2 (two) times daily.   diclofenac Sodium (VOLTAREN) 1 % GEL Apply 1 application  topically 4 (four) times daily as needed (arthritis pain (hands)).   isosorbide mononitrate (IMDUR) 30 MG 24 hr tablet Take 1 tablet (30 mg total) by mouth at bedtime.   meloxicam (MOBIC) 15 MG tablet Take 15 mg by mouth daily.   metoprolol succinate (TOPROL XL) 25 MG 24 hr tablet Take 0.5 tablets (12.5 mg total) by mouth at bedtime.   Multiple Vitamin (MULTIVITAMIN WITH MINERALS) TABS tablet Take 1 tablet by mouth in the morning.   nitroGLYCERIN (NITROSTAT) 0.4 MG  SL tablet Place 1 tablet (0.4 mg total) under the tongue every 5 (five) minutes as needed for chest pain. x 3 doses per chest pain episode.  Call 911 if third dose is taken.   Current Facility-Administered Medications for the 07/31/21 encounter (Office Visit) with Azalee Course, PA  Medication   sodium chloride flush (NS) 0.9 % injection 3 mL     Allergies:   Aleve [naproxen]   Social History   Socioeconomic History   Marital status: Married    Spouse name: Not on file   Number of children: Not on file   Years of education: Not on file   Highest education level: Not on file  Occupational History   Not on file  Tobacco Use   Smoking status: Former   Smokeless tobacco: Never   Tobacco comments:    QUIT IN 2004  Vaping Use   Vaping Use: Never used  Substance and Sexual Activity   Alcohol use: No    Comment: no etoh in 18 years    Drug use: No   Sexual activity: Not on file  Other Topics Concern   Not on file  Social History Narrative   Not on file   Social Determinants of Health   Financial Resource Strain: Not on file  Food Insecurity: Not on file  Transportation Needs: Not on file  Physical Activity: Not on file  Stress: Not on file  Social Connections: Not on file     Family History: The patient's family history is not on file.  ROS:   Please see the history of present illness.     All other systems reviewed and are negative.  EKGs/Labs/Other Studies Reviewed:    The following studies were reviewed today:  Echo 07/11/2021  1. Left ventricular ejection fraction, by estimation, is 65 to 70%. The  left ventricle has normal function. The left ventricle has no regional  wall motion abnormalities. Left ventricular diastolic parameters were  normal.   2. Right ventricular systolic function is normal. The right ventricular  size is normal.   3. Left atrial size was severely dilated.   4. Right atrial size was mildly dilated.   5. The mitral valve is normal in  structure. Trivial mitral valve  regurgitation. No evidence of mitral stenosis.   6. The aortic valve is tricuspid. Aortic valve regurgitation is not  visualized. No aortic stenosis is present. Aortic valve area, by VTI  measures 2.52 cm. Aortic valve mean gradient measures 7.0 mmHg. Aortic  valve Vmax measures 1.86 m/s.   7. The inferior vena cava is normal in size with greater than 50%  respiratory variability, suggesting right atrial pressure of 3 mmHg.    Coronary CT 07/13/2021 IMPRESSION: 1. Coronary calcium score of 325. This was 71st percentile for age-, sex, and race-matched controls.  2. Normal coronary origin with right dominance.   3. Severe mixed density plaque (70-99%) in the proximal LAD.   4. Mild non-calcified plaque (25-49%) in the mid LCX.   5. Minimal calcified plaque (<25%) in the RCA.   RECOMMENDATIONS: 1. Severe stenosis in the proximal LAD (70-99%). CT FFR will be submitted. Consider symptom-guided anti-ischemic pharmacotherapy as well as risk factor modification per guideline directed care. Invasive coronary angiography recommended with revascularization per published guideline statements.  FINDINGS: 1. Left Main: 0.99; low likelihood of hemodynamic significance.   2. Proximal LAD: 0.52; high likelihood of hemodynamic significance. 3. LCX: 0.93; low likelihood of hemodynamic significance. 4. RCA: 0.90; low likelihood of hemodynamic significance.   IMPRESSION:   1.  Proximal LAD is highly positive by CT FFR (0.52).  EKG:  EKG is ordered today.  The ekg ordered today demonstrates normal sinus rhythm, no significant ST-T wave changes.  Recent Labs: 07/31/2021: BUN 17; Creatinine, Ser 0.77; Hemoglobin 14.1; Platelets 203; Potassium 4.4; Sodium 144  Recent Lipid Panel No results found for: "CHOL", "TRIG", "HDL", "CHOLHDL", "VLDL", "LDLCALC", "LDLDIRECT"   Risk Assessment/Calculations:           Physical Exam:    VS:  BP 132/84   Pulse (!) 56    Ht 5\' 11"  (1.803 m)   Wt 214 lb 14.4 oz (97.5 kg)   SpO2 96%   BMI 29.97 kg/m     Wt Readings from Last 3 Encounters:  07/31/21 214 lb 14.4 oz (97.5 kg)  06/22/21 217 lb 6.4 oz (98.6 kg)  01/01/20 211 lb 12.8 oz (96.1 kg)     GEN:  Well nourished, well developed in no acute distress HEENT: Normal NECK: No JVD; No carotid bruits LYMPHATICS: No lymphadenopathy CARDIAC: RRR, no murmurs, rubs, gallops RESPIRATORY:  Clear to auscultation without rales, wheezing or rhonchi  ABDOMEN: Soft, non-tender, non-distended MUSCULOSKELETAL:  No edema; No deformity  SKIN: Warm and dry NEUROLOGIC:  Alert and oriented x 3 PSYCHIATRIC:  Normal affect   ASSESSMENT:    1. Coronary artery disease of native artery of native heart with stable angina pectoris (HCC)    PLAN:    In order of problems listed above:  Abnormal coronary CT: Patient recently underwent coronary CT due to 2 episodes of exertional chest tightness and worsening dyspnea on exertion.  He has a brother who has CAD as well.  Coronary CT showed significant proximal LAD lesion with positive FFR.  He has not had any further chest discomfort since the last visit.  Risk and the benefit of the upcoming cardiac catheterization has been discussed with the patient, he is agreeable to proceed.  Continue aspirin and Lipitor.  HLD: On Lipitor 40 mg daily.  Plan to repeat fasting lipid panel and LFT in 6 to 8 weeks.      Shared Decision Making/Informed Consent The risks [stroke (1 in 1000), death (1 in 1000), kidney failure [usually temporary] (1 in 500), bleeding (1 in 200), allergic reaction [possibly serious] (1 in 200)], benefits (diagnostic support and management of coronary artery disease) and alternatives of a cardiac catheterization were discussed in detail with Mr. Hirt and he is willing to proceed.    Medication Adjustments/Labs and Tests Ordered: Current medicines are reviewed at length with the patient today.  Concerns regarding  medicines are outlined above.  Orders Placed This Encounter  Procedures   Basic metabolic panel   CBC   EKG 12-Lead   No orders of the defined types were placed  in this encounter.   Patient Instructions  Medication Instructions:  No Changes *If you need a refill on your cardiac medications before your next appointment, please call your pharmacy*   Lab Work: BMET, CBC If you have labs (blood work) drawn today and your tests are completely normal, you will receive your results only by: [place] (if you have MyChart) OR A paper copy in the mail If you have any lab test that is abnormal or we need to change your treatment, we will call you to review the results.   Testing/Procedures: No Testing   Follow-Up: At [facility], you and your health needs are our priority.  As part of our continuing mission to provide you with exceptional heart care, we have created designated Provider Care Teams.  These Care Teams include your primary Cardiologist (physician) and Advanced Practice Providers (APPs -  Physician Assistants and Nurse Practitioners) who all work together to provide you with the care you need, when you need it.  We recommend signing up for the patient portal called "MyChart".  Sign up information is provided on this After Visit Summary.  MyChart is used to connect with patients for Virtual Visits (Telemedicine).  Patients are able to view lab/test results, encounter notes, upcoming appointments, etc.  Non-urgent messages can be sent to your provider as well.   To learn more about what you can do with MyChart, go to [email or web address].    Your next appointment:   2-4  week(s) Post Catherization  The format for your next appointment:   In Person  Provider:   Early Osmond, MD       Important Information About Sugar         Signed, Almyra Deforest, [place]  08/01/2021 4:53 PM    [place]

## 2021-07-31 NOTE — H&P (View-Only) (Signed)
Cardiology Office Note:    Date:  08/01/2021   ID:  Travis Simmons, DOB 04/20/1955, MRN [id number]  PCP:  Meyers, Stephen, MD   [facility] Providers Cardiologist:  Arun K Thukkani, MD     Referring MD: Meyers, Stephen, MD   Chief Complaint  Patient presents with   Pre-op Exam   Follow-up    Abnormal coronary CT    History of Present Illness:    Travis Simmons is a 66 y.o. male with a hx of asthma, aortic atherosclerosis on previous CT, GERD and prediabetes who was recently seen on 06/22/2021 for chest tightness and dyspnea on exertion.  He has family history of CAD as well.  His brother has a history of bypass surgery.  Renal function and electrolyte was normal.  Echocardiogram obtained on 07/11/2021 demonstrated EF 65 to 70%, no regional wall motion abnormality, trivial MR.  Coronary CT obtained on 07/13/2021 demonstrated 70 to 99% stenosis in proximal LAD, 25 to 49% mid LAD lesion, less than 25% disease in proximal to mid RCA.  Coronary calcium score 325 which placed the patient on 71st percentile for age and sex matched control.  Patient does have 8 x 5 mm right middle lobe lung nodule, repeat noncontrast CT of chest in 6 to 12 months was recommended.  FFR was positive in proximal LAD at 0.52.  Patient was placed on aspirin, Lipitor, Imdur and Toprol-XL.  Outpatient cardiac catheterization was recommended.  Patient presents today for discussion prior to cardiac catheterization.  I reviewed the recent coronary CT.  Dr. Thukkani has asked PCP to follow-up on the pulmonary nodule.  Since his last visit, he has not had any further exertional chest discomfort.  So far he only had 2 episode of exertional chest pain.  The last episode occurred while he was climbing up a incline prior to his last cardiology visit.  EKG obtained today showed no obvious ST-T wave changes.  We will obtain basic metabolic panel and CBC today.  He will undergo cardiac catheterization this Wednesday.  We plan to see him  back in 2 to 4 weeks after the cath.  Risk and benefit of the procedure has been discussed with the patient who is agreeable to proceed.  Past Medical History:  Diagnosis Date   Arthritis    Asthma    Atherosclerosis of aorta (HCC)    GERD (gastroesophageal reflux disease)    hx of    Gout    Obesity    Osteoarthritis    Paresthesia    Pre-diabetes     Past Surgical History:  Procedure Laterality Date   left knee arthroscopy      REVERSE SHOULDER ARTHROPLASTY Right 01/01/2020   Procedure: REVERSE SHOULDER ARTHROPLASTY;  Surgeon: Norris, Steve, MD;  Location: [facility];  Service: Orthopedics;  Laterality: Right;  interscalene block   TOTAL SHOULDER ARTHROPLASTY Left 03/01/2017   TOTAL SHOULDER ARTHROPLASTY Left 03/01/2017   Procedure: LEFT SHOULDER ANATOMIC TOTAL SHOULDER ARTHROPLASTY;  Surgeon: Norris, Steve, MD;  Location: [facility];  Service: Orthopedics;  Laterality: Left;    Current Medications: Current Meds  Medication Sig   albuterol (PROVENTIL HFA;VENTOLIN HFA) 108 (90 Base) MCG/ACT inhaler Inhale 2 puffs into the lungs every 4 (four) hours as needed for wheezing or shortness of breath.   aspirin EC 81 MG tablet Take 1 tablet (81 mg total) by mouth daily. Swallow whole.   atorvastatin (LIPITOR) 40 MG tablet Take 1 tablet (40 mg total) by mouth daily.     budesonide-formoterol (SYMBICORT) 160-4.5 MCG/ACT inhaler Inhale 2 puffs into the lungs 2 (two) times daily.   diclofenac Sodium (VOLTAREN) 1 % GEL Apply 1 application  topically 4 (four) times daily as needed (arthritis pain (hands)).   isosorbide mononitrate (IMDUR) 30 MG 24 hr tablet Take 1 tablet (30 mg total) by mouth at bedtime.   meloxicam (MOBIC) 15 MG tablet Take 15 mg by mouth daily.   metoprolol succinate (TOPROL XL) 25 MG 24 hr tablet Take 0.5 tablets (12.5 mg total) by mouth at bedtime.   Multiple Vitamin (MULTIVITAMIN WITH MINERALS) TABS tablet Take 1 tablet by mouth in the morning.   nitroGLYCERIN (NITROSTAT) 0.4 MG  SL tablet Place 1 tablet (0.4 mg total) under the tongue every 5 (five) minutes as needed for chest pain. x 3 doses per chest pain episode.  Call 911 if third dose is taken.   Current Facility-Administered Medications for the 07/31/21 encounter (Office Visit) with Azalee Course, PA  Medication   sodium chloride flush (NS) 0.9 % injection 3 mL     Allergies:   Aleve [naproxen]   Social History   Socioeconomic History   Marital status: Married    Spouse name: Not on file   Number of children: Not on file   Years of education: Not on file   Highest education level: Not on file  Occupational History   Not on file  Tobacco Use   Smoking status: Former   Smokeless tobacco: Never   Tobacco comments:    QUIT IN 2004  Vaping Use   Vaping Use: Never used  Substance and Sexual Activity   Alcohol use: No    Comment: no etoh in 18 years    Drug use: No   Sexual activity: Not on file  Other Topics Concern   Not on file  Social History Narrative   Not on file   Social Determinants of Health   Financial Resource Strain: Not on file  Food Insecurity: Not on file  Transportation Needs: Not on file  Physical Activity: Not on file  Stress: Not on file  Social Connections: Not on file     Family History: The patient's family history is not on file.  ROS:   Please see the history of present illness.     All other systems reviewed and are negative.  EKGs/Labs/Other Studies Reviewed:    The following studies were reviewed today:  Echo 07/11/2021  1. Left ventricular ejection fraction, by estimation, is 65 to 70%. The  left ventricle has normal function. The left ventricle has no regional  wall motion abnormalities. Left ventricular diastolic parameters were  normal.   2. Right ventricular systolic function is normal. The right ventricular  size is normal.   3. Left atrial size was severely dilated.   4. Right atrial size was mildly dilated.   5. The mitral valve is normal in  structure. Trivial mitral valve  regurgitation. No evidence of mitral stenosis.   6. The aortic valve is tricuspid. Aortic valve regurgitation is not  visualized. No aortic stenosis is present. Aortic valve area, by VTI  measures 2.52 cm. Aortic valve mean gradient measures 7.0 mmHg. Aortic  valve Vmax measures 1.86 m/s.   7. The inferior vena cava is normal in size with greater than 50%  respiratory variability, suggesting right atrial pressure of 3 mmHg.    Coronary CT 07/13/2021 IMPRESSION: 1. Coronary calcium score of 325. This was 71st percentile for age-, sex, and race-matched controls.  2. Normal coronary origin with right dominance.   3. Severe mixed density plaque (70-99%) in the proximal LAD.   4. Mild non-calcified plaque (25-49%) in the mid LCX.   5. Minimal calcified plaque (<25%) in the RCA.   RECOMMENDATIONS: 1. Severe stenosis in the proximal LAD (70-99%). CT FFR will be submitted. Consider symptom-guided anti-ischemic pharmacotherapy as well as risk factor modification per guideline directed care. Invasive coronary angiography recommended with revascularization per published guideline statements.  FINDINGS: 1. Left Main: 0.99; low likelihood of hemodynamic significance.   2. Proximal LAD: 0.52; high likelihood of hemodynamic significance. 3. LCX: 0.93; low likelihood of hemodynamic significance. 4. RCA: 0.90; low likelihood of hemodynamic significance.   IMPRESSION:   1.  Proximal LAD is highly positive by CT FFR (0.52).  EKG:  EKG is ordered today.  The ekg ordered today demonstrates normal sinus rhythm, no significant ST-T wave changes.  Recent Labs: 07/31/2021: BUN 17; Creatinine, Ser 0.77; Hemoglobin 14.1; Platelets 203; Potassium 4.4; Sodium 144  Recent Lipid Panel No results found for: "CHOL", "TRIG", "HDL", "CHOLHDL", "VLDL", "LDLCALC", "LDLDIRECT"   Risk Assessment/Calculations:           Physical Exam:    VS:  BP 132/84   Pulse (!) 56    Ht 5\' 11"  (1.803 m)   Wt 214 lb 14.4 oz (97.5 kg)   SpO2 96%   BMI 29.97 kg/m     Wt Readings from Last 3 Encounters:  07/31/21 214 lb 14.4 oz (97.5 kg)  06/22/21 217 lb 6.4 oz (98.6 kg)  01/01/20 211 lb 12.8 oz (96.1 kg)     GEN:  Well nourished, well developed in no acute distress HEENT: Normal NECK: No JVD; No carotid bruits LYMPHATICS: No lymphadenopathy CARDIAC: RRR, no murmurs, rubs, gallops RESPIRATORY:  Clear to auscultation without rales, wheezing or rhonchi  ABDOMEN: Soft, non-tender, non-distended MUSCULOSKELETAL:  No edema; No deformity  SKIN: Warm and dry NEUROLOGIC:  Alert and oriented x 3 PSYCHIATRIC:  Normal affect   ASSESSMENT:    1. Coronary artery disease of native artery of native heart with stable angina pectoris (HCC)    PLAN:    In order of problems listed above:  Abnormal coronary CT: Patient recently underwent coronary CT due to 2 episodes of exertional chest tightness and worsening dyspnea on exertion.  He has a brother who has CAD as well.  Coronary CT showed significant proximal LAD lesion with positive FFR.  He has not had any further chest discomfort since the last visit.  Risk and the benefit of the upcoming cardiac catheterization has been discussed with the patient, he is agreeable to proceed.  Continue aspirin and Lipitor.  HLD: On Lipitor 40 mg daily.  Plan to repeat fasting lipid panel and LFT in 6 to 8 weeks.      Shared Decision Making/Informed Consent The risks [stroke (1 in 1000), death (1 in 1000), kidney failure [usually temporary] (1 in 500), bleeding (1 in 200), allergic reaction [possibly serious] (1 in 200)], benefits (diagnostic support and management of coronary artery disease) and alternatives of a cardiac catheterization were discussed in detail with Travis Simmons and he is willing to proceed.    Medication Adjustments/Labs and Tests Ordered: Current medicines are reviewed at length with the patient today.  Concerns regarding  medicines are outlined above.  Orders Placed This Encounter  Procedures   Basic metabolic panel   CBC   EKG 12-Lead   No orders of the defined types were placed  in this encounter.   Patient Instructions  Medication Instructions:  No Changes *If you need a refill on your cardiac medications before your next appointment, please call your pharmacy*   Lab Work: BMET, CBC If you have labs (blood work) drawn today and your tests are completely normal, you will receive your results only by: MyChart Message (if you have MyChart) OR A paper copy in the mail If you have any lab test that is abnormal or we need to change your treatment, we will call you to review the results.   Testing/Procedures: No Testing   Follow-Up: At [facility], you and your health needs are our priority.  As part of our continuing mission to provide you with exceptional heart care, we have created designated Provider Care Teams.  These Care Teams include your primary Cardiologist (physician) and Advanced Practice Providers (APPs -  Physician Assistants and Nurse Practitioners) who all work together to provide you with the care you need, when you need it.  We recommend signing up for the patient portal called "MyChart".  Sign up information is provided on this After Visit Summary.  MyChart is used to connect with patients for Virtual Visits (Telemedicine).  Patients are able to view lab/test results, encounter notes, upcoming appointments, etc.  Non-urgent messages can be sent to your provider as well.   To learn more about what you can do with MyChart, go to [email or web address].    Your next appointment:   2-4  week(s) Post Catherization  The format for your next appointment:   In Person  Provider:   Arun K Thukkani, MD       Important Information About Sugar         Signed, Bonham Zingale, PA  08/01/2021 4:53 PM    [facility] 

## 2021-07-31 NOTE — Patient Instructions (Signed)
Medication Instructions:  No Changes *If you need a refill on your cardiac medications before your next appointment, please call your pharmacy*   Lab Work: BMET, CBC If you have labs (blood work) drawn today and your tests are completely normal, you will receive your results only by: MyChart Message (if you have MyChart) OR A paper copy in the mail If you have any lab test that is abnormal or we need to change your treatment, we will call you to review the results.   Testing/Procedures: No Testing   Follow-Up: At [facility], you and your health needs are our priority.  As part of our continuing mission to provide you with exceptional heart care, we have created designated Provider Care Teams.  These Care Teams include your primary Cardiologist (physician) and Advanced Practice Providers (APPs -  Physician Assistants and Nurse Practitioners) who all work together to provide you with the care you need, when you need it.  We recommend signing up for the patient portal called "MyChart".  Sign up information is provided on this After Visit Summary.  MyChart is used to connect with patients for Virtual Visits (Telemedicine).  Patients are able to view lab/test results, encounter notes, upcoming appointments, etc.  Non-urgent messages can be sent to your provider as well.   To learn more about what you can do with MyChart, go to [email or web address].    Your next appointment:   2-4  week(s) Post Catherization  The format for your next appointment:   In Person  Provider:   Orbie Pyo, MD       Important Information About Sugar

## 2021-08-01 ENCOUNTER — Encounter: Payer: Self-pay | Admitting: Physician Assistant

## 2021-08-01 ENCOUNTER — Telehealth: Payer: Self-pay | Admitting: *Deleted

## 2021-08-01 ENCOUNTER — Other Ambulatory Visit: Payer: Self-pay | Admitting: Physician Assistant

## 2021-08-01 LAB — BASIC METABOLIC PANEL
BUN/Creatinine Ratio: 22 (ref 10–24)
BUN: 17 mg/dL (ref 8–27)
CO2: 23 mmol/L (ref 20–29)
Calcium: 9.9 mg/dL (ref 8.6–10.2)
Chloride: 105 mmol/L (ref 96–106)
Creatinine, Ser: 0.77 mg/dL (ref 0.76–1.27)
Glucose: 90 mg/dL (ref 70–99)
Potassium: 4.4 mmol/L (ref 3.5–5.2)
Sodium: 144 mmol/L (ref 134–144)
eGFR: 98 mL/min/{1.73_m2} (ref >59)

## 2021-08-01 LAB — CBC
Hematocrit: 39.6 % (ref 37.5–51.0)
Hemoglobin: 14.1 g/dL (ref 13.0–17.7)
MCH: 33.1 pg — ABNORMAL HIGH (ref 26.6–33.0)
MCHC: 35.6 g/dL (ref 31.5–35.7)
MCV: 93 fL (ref 79–97)
Platelets: 203 10*3/uL (ref 150–450)
RBC: 4.26 x10E6/uL (ref 4.14–5.80)
RDW: 12.4 % (ref 11.6–15.4)
WBC: 6.4 10*3/uL (ref 3.4–10.8)

## 2021-08-01 MED ORDER — SODIUM CHLORIDE 0.9% FLUSH: 3.0000 mL | Freq: Two times a day (BID) | INTRAVENOUS | Status: DC

## 2021-08-01 NOTE — Telephone Encounter (Signed)
Cardiac Catheterization scheduled at [facility] for: Wednesday August 02, 2021 8:30 AM Arrival time and place: [place] Main Entrance A at: 6:30 AM   Nothing to eat after midnight prior to procedure, clear liquids until 5 AM day of procedure.  Medication instructions: -Usual morning medications can be taken with sips of water including aspirin 81 mg.  Confirmed patient has responsible adult to drive home post procedure and be with patient first 24 hours after arriving home.  Patient reports no new symptoms concerning for COVID-19 in the past 10 days.   Reviewed procedure instructions with patient.  

## 2021-08-02 ENCOUNTER — Ambulatory Visit (HOSPITAL_COMMUNITY)
Admission: RE | Admit: 2021-08-02 | Discharge: 2021-08-02 | Disposition: A | Discharge status: Home / Self Care | Payer: Medicare HMO | Attending: Internal Medicine | Admitting: Internal Medicine

## 2021-08-02 ENCOUNTER — Ambulatory Visit (HOSPITAL_COMMUNITY)
Admission: RE | Disposition: A | Discharge status: Home / Self Care | Payer: Self-pay | Source: Home / Self Care | Attending: Internal Medicine

## 2021-08-02 ENCOUNTER — Other Ambulatory Visit (HOSPITAL_COMMUNITY): Payer: Self-pay

## 2021-08-02 ENCOUNTER — Other Ambulatory Visit: Payer: Self-pay

## 2021-08-02 DIAGNOSIS — I25119 Atherosclerotic heart disease of native coronary artery with unspecified angina pectoris: Secondary | ICD-10-CM | POA: Insufficient documentation

## 2021-08-02 DIAGNOSIS — K219 Gastro-esophageal reflux disease without esophagitis: Secondary | ICD-10-CM | POA: Diagnosis not present

## 2021-08-02 DIAGNOSIS — I2510 Atherosclerotic heart disease of native coronary artery without angina pectoris: Secondary | ICD-10-CM

## 2021-08-02 DIAGNOSIS — E785 Hyperlipidemia, unspecified: Secondary | ICD-10-CM | POA: Diagnosis not present

## 2021-08-02 DIAGNOSIS — Z79899 Other long term (current) drug therapy: Secondary | ICD-10-CM | POA: Diagnosis not present

## 2021-08-02 DIAGNOSIS — R7303 Prediabetes: Secondary | ICD-10-CM | POA: Diagnosis not present

## 2021-08-02 DIAGNOSIS — Z8249 Family history of ischemic heart disease and other diseases of the circulatory system: Secondary | ICD-10-CM | POA: Diagnosis not present

## 2021-08-02 DIAGNOSIS — I25118 Atherosclerotic heart disease of native coronary artery with other forms of angina pectoris: Secondary | ICD-10-CM

## 2021-08-02 DIAGNOSIS — J45909 Unspecified asthma, uncomplicated: Secondary | ICD-10-CM | POA: Insufficient documentation

## 2021-08-02 DIAGNOSIS — I209 Angina pectoris, unspecified: Secondary | ICD-10-CM | POA: Diagnosis present

## 2021-08-02 DIAGNOSIS — Z955 Presence of coronary angioplasty implant and graft: Secondary | ICD-10-CM

## 2021-08-02 DIAGNOSIS — Z87891 Personal history of nicotine dependence: Secondary | ICD-10-CM | POA: Insufficient documentation

## 2021-08-02 LAB — POCT ACTIVATED CLOTTING TIME
Activated Clotting Time: 269 seconds
Activated Clotting Time: 311 seconds
Activated Clotting Time: 594 seconds
Activated Clotting Time: 269 seconds

## 2021-08-02 SURGERY — LEFT HEART CATH AND CORONARY ANGIOGRAPHY
Anesthesia: LOCAL

## 2021-08-02 MED ORDER — LIDOCAINE HCL (PF) 1 % IJ SOLN
INTRAMUSCULAR | Status: DC | PRN
  Administered 2021-08-02: 3 mL

## 2021-08-02 MED ORDER — ASPIRIN 81 MG PO CHEW: 81.0000 mg | CHEWABLE_TABLET | ORAL | Status: DC

## 2021-08-02 MED ORDER — FENTANYL CITRATE (PF) 100 MCG/2ML IJ SOLN
INTRAMUSCULAR | Status: DC | PRN
  Administered 2021-08-02 (×2): 25 ug via INTRAVENOUS

## 2021-08-02 MED ORDER — HEPARIN (PORCINE) IN NACL 1000-0.9 UT/500ML-% IV SOLN
INTRAVENOUS | Status: AC
  Filled 2021-08-02: qty 1000

## 2021-08-02 MED ORDER — SODIUM CHLORIDE 0.9% FLUSH: 3.0000 mL | Freq: Two times a day (BID) | INTRAVENOUS | Status: DC

## 2021-08-02 MED ORDER — MIDAZOLAM HCL 2 MG/2ML IJ SOLN
INTRAMUSCULAR | Status: DC | PRN
  Administered 2021-08-02 (×2): 1 mg via INTRAVENOUS

## 2021-08-02 MED ORDER — HYDRALAZINE HCL 20 MG/ML IJ SOLN: 10.0000 mg | INTRAMUSCULAR | Status: DC | PRN

## 2021-08-02 MED ORDER — VERAPAMIL HCL 2.5 MG/ML IV SOLN
INTRAVENOUS | Status: DC | PRN
  Administered 2021-08-02: 10 mL via INTRA_ARTERIAL

## 2021-08-02 MED ORDER — HEPARIN SODIUM (PORCINE) 1000 UNIT/ML IJ SOLN
INTRAMUSCULAR | Status: DC | PRN
  Administered 2021-08-02: 3000 [IU] via INTRAVENOUS
  Administered 2021-08-02 (×2): 5000 [IU] via INTRAVENOUS
  Administered 2021-08-02: 4000 [IU] via INTRAVENOUS

## 2021-08-02 MED ORDER — SODIUM CHLORIDE 0.9% FLUSH: 3.0000 mL | INTRAVENOUS | Status: DC | PRN

## 2021-08-02 MED ORDER — CLOPIDOGREL BISULFATE 75 MG PO TABS: 75.0000 mg | ORAL_TABLET | Freq: Every day | ORAL | 6 refills | Status: DC

## 2021-08-02 MED ORDER — SODIUM CHLORIDE 0.9 % IV SOLN: INTRAVENOUS | Status: AC

## 2021-08-02 MED ORDER — LIDOCAINE HCL (PF) 1 % IJ SOLN
INTRAMUSCULAR | Status: AC
  Filled 2021-08-02: qty 30

## 2021-08-02 MED ORDER — NITROGLYCERIN 1 MG/10 ML FOR IR/CATH LAB
INTRA_ARTERIAL | Status: AC
  Filled 2021-08-02: qty 10

## 2021-08-02 MED ORDER — ASPIRIN 81 MG PO CHEW: 81.0000 mg | CHEWABLE_TABLET | Freq: Every day | ORAL | Status: DC

## 2021-08-02 MED ORDER — SODIUM CHLORIDE 0.9 % WEIGHT BASED INFUSION: 1.0000 mL/kg/h | INTRAVENOUS | Status: DC

## 2021-08-02 MED ORDER — HEPARIN SODIUM (PORCINE) 1000 UNIT/ML IJ SOLN
INTRAMUSCULAR | Status: AC
  Filled 2021-08-02: qty 10

## 2021-08-02 MED ORDER — ONDANSETRON HCL 4 MG/2ML IJ SOLN: 4.0000 mg | Freq: Four times a day (QID) | INTRAMUSCULAR | Status: DC | PRN

## 2021-08-02 MED ORDER — IOHEXOL 350 MG/ML SOLN
INTRAVENOUS | Status: DC | PRN
  Administered 2021-08-02: 120 mL

## 2021-08-02 MED ORDER — CLOPIDOGREL BISULFATE 75 MG PO TABS: 75.0000 mg | ORAL_TABLET | Freq: Every day | ORAL | Status: DC

## 2021-08-02 MED ORDER — ACETAMINOPHEN 325 MG PO TABS: 650.0000 mg | ORAL_TABLET | ORAL | Status: DC | PRN

## 2021-08-02 MED ORDER — HYDRALAZINE HCL 20 MG/ML IJ SOLN
INTRAMUSCULAR | Status: DC | PRN
  Administered 2021-08-02: 10 mg via INTRAVENOUS

## 2021-08-02 MED ORDER — CLOPIDOGREL BISULFATE 75 MG PO TABS
75.0000 mg | ORAL_TABLET | Freq: Every day | ORAL | 6 refills | Status: DC
  Filled 2021-08-02: qty 30, 30d supply, fill #0

## 2021-08-02 MED ORDER — FENTANYL CITRATE (PF) 100 MCG/2ML IJ SOLN
INTRAMUSCULAR | Status: AC
  Filled 2021-08-02: qty 2

## 2021-08-02 MED ORDER — ASPIRIN 81 MG PO TBEC
81.0000 mg | DELAYED_RELEASE_TABLET | Freq: Every day | ORAL | 0 refills | Status: DC
  Filled 2021-08-02: qty 30, 30d supply, fill #0

## 2021-08-02 MED ORDER — CLOPIDOGREL BISULFATE 300 MG PO TABS
ORAL_TABLET | ORAL | Status: DC | PRN
  Administered 2021-08-02: 600 mg via ORAL

## 2021-08-02 MED ORDER — CLOPIDOGREL BISULFATE 300 MG PO TABS
ORAL_TABLET | ORAL | Status: AC
  Filled 2021-08-02: qty 2

## 2021-08-02 MED ORDER — HEPARIN (PORCINE) IN NACL 1000-0.9 UT/500ML-% IV SOLN
INTRAVENOUS | Status: DC | PRN
  Administered 2021-08-02 (×2): 500 mL

## 2021-08-02 MED ORDER — MIDAZOLAM HCL 2 MG/2ML IJ SOLN
INTRAMUSCULAR | Status: AC
  Filled 2021-08-02: qty 2

## 2021-08-02 MED ORDER — LABETALOL HCL 5 MG/ML IV SOLN: 10.0000 mg | INTRAVENOUS | Status: DC | PRN

## 2021-08-02 MED ORDER — ANGIOPLASTY BOOK
Status: AC
  Filled 2021-08-02: qty 1

## 2021-08-02 MED ORDER — SODIUM CHLORIDE 0.9 % IV SOLN: 250.0000 mL | INTRAVENOUS | Status: DC | PRN

## 2021-08-02 MED ORDER — SODIUM CHLORIDE 0.9 % WEIGHT BASED INFUSION
3.0000 mL/kg/h | INTRAVENOUS | Status: AC
  Administered 2021-08-02: 3 mL/kg/h via INTRAVENOUS

## 2021-08-02 MED ORDER — ASPIRIN 81 MG PO TBEC: 81.0000 mg | DELAYED_RELEASE_TABLET | Freq: Every day | ORAL | 0 refills | Status: DC

## 2021-08-02 MED ORDER — HYDRALAZINE HCL 20 MG/ML IJ SOLN
INTRAMUSCULAR | Status: AC
  Filled 2021-08-02: qty 1

## 2021-08-02 MED ORDER — VERAPAMIL HCL 2.5 MG/ML IV SOLN
INTRAVENOUS | Status: AC
  Filled 2021-08-02: qty 2

## 2021-08-02 SURGICAL SUPPLY — 25 items
BALL SAPPHIRE NC24 3.0X15 (BALLOONS) ×2
BALL SAPPHIRE NC24 3.5X12 (BALLOONS) ×2
BALLN SAPPHIRE 2.5X12 (BALLOONS) ×2
BALLOON SAPPHIRE 2.5X12 (BALLOONS) IMPLANT
BALLOON SAPPHIRE NC24 3.0X15 (BALLOONS) IMPLANT
BALLOON SAPPHIRE NC24 3.5X12 (BALLOONS) IMPLANT
BAND ZEPHYR COMPRESS 30 LONG (HEMOSTASIS) ×1 IMPLANT
CATH DIAG 6FR JR4 (CATHETERS) ×1 IMPLANT
CATH DRAGONFLY OPSTAR (CATHETERS) ×1 IMPLANT
CATH INFINITI 6F FL3.5 (CATHETERS) ×1 IMPLANT
CATH LAUNCHER 6FR EBU3.5 (CATHETERS) ×1 IMPLANT
GLIDESHEATH SLEND SS 6F .021 (SHEATH) ×1 IMPLANT
GUIDEWIRE INQWIRE 1.5J.035X260 (WIRE) IMPLANT
GUIDEWIRE VAS SION BLUE 190 (WIRE) ×1 IMPLANT
INQWIRE 1.5J .035X260CM (WIRE) ×2
KIT ENCORE 26 ADVANTAGE (KITS) ×1 IMPLANT
KIT ESSENTIALS PG (KITS) ×1 IMPLANT
KIT HEART LEFT (KITS) ×2 IMPLANT
KIT HEMO VALVE WATCHDOG (MISCELLANEOUS) ×1 IMPLANT
PACK CARDIAC CATHETERIZATION (CUSTOM PROCEDURE TRAY) ×2 IMPLANT
STENT SYNERGY XD 3.0X28 (Permanent Stent) IMPLANT
SYNERGY XD 3.0X28 (Permanent Stent) ×2 IMPLANT
SYR MEDRAD MARK 7 150ML (SYRINGE) ×2 IMPLANT
TRANSDUCER W/STOPCOCK (MISCELLANEOUS) ×2 IMPLANT
TUBING CIL FLEX 10 FLL-RA (TUBING) ×2 IMPLANT

## 2021-08-02 NOTE — Progress Notes (Signed)
Radial site bruised and 3cm area of firmness noted and pressure held x and site softer now

## 2021-08-02 NOTE — Discharge Summary (Addendum)
Discharge Summary for Same Day PCI   Patient ID: Travis Simmons MRN: [id number]; DOB: September 07, 1955  Admit date: 08/02/2021 Discharge date: 08/02/2021  Primary Care Provider: Orpah Melter, MD  Primary Cardiologist: Early Osmond, MD  Primary Electrophysiologist:  None   Discharge Diagnoses    Principal Problem:   CAD (coronary artery disease) Active Problems:   Hyperlipidemia   Diagnostic Studies/Procedures    Cardiac Catheterization 08/02/2021:    Prox RCA lesion is 10% stenosed.   Prox LAD lesion is 80% stenosed.   A stent was successfully placed.   Post intervention, there is a 0% residual stenosis.   LV end diastolic pressure is normal.   1.  High-grade proximal LAD lesion treated with OCT guided PCI with 1 drug-eluting stent with mild disease elsewhere. 2.  LVEDP of 7 to 9 mmHg.   Recommendation: Dual antiplatelet therapy for 6 months and same-day discharge for PCI with cardiology follow-up.   Diagnostic Dominance: Right  Intervention   _____________   History of Present Illness     Travis Simmons is a 66 y.o. male with a hx of asthma, aortic atherosclerosis on previous CT, GERD and prediabetes who was recently seen on 06/22/2021 for chest tightness and dyspnea on exertion.  He has family history of CAD as well.  His brother has a history of bypass surgery.  Renal function and electrolyte was normal.  Echocardiogram obtained on 07/11/2021 demonstrated EF 65 to 70%, no regional wall motion abnormality, trivial MR.  Coronary CT obtained on 07/13/2021 demonstrated 70 to 99% stenosis in proximal LAD, 25 to 49% mid LAD lesion, less than 25% disease in proximal to mid RCA.  Coronary calcium score 325 which placed the patient on 71st percentile for age and sex matched control.  Patient does have 8 x 5 mm right middle lobe lung nodule, repeat noncontrast CT of chest in 6 to 12 months was recommended.  FFR was positive in proximal LAD at 0.52.  Patient was placed on aspirin,  Lipitor, Imdur and Toprol-XL.  Outpatient cardiac catheterization was recommended.   Patient presented back to the office for discussion prior to cardiac catheterization.  Recent coronary CT was reviewed with MD. Dr. Ali Lowe had asked PCP to follow-up on the pulmonary nodule. He had not had any further exertional chest discomfort.  So far he only had 2 episode of exertional chest pain.  The last episode occurred while he was climbing up a incline prior to his last cardiology visit.  EKG obtained in the office showed no obvious ST-T wave changes. He ws set up for cardiac catheterization.  Hospital Course     The patient underwent cardiac cath as noted above with high grade pLAD lesion treated with OCT guided PCI/DESx1.. Plan for DAPT with ASA/plavix for at least 6 months. The patient was seen by cardiac rehab while in short stay. There were no observed complications post cath. Radial cath site was re-evaluated prior to discharge and found to be stable without any complications. Instructions/precautions regarding cath site care were given prior to discharge.  Travis Simmons was seen by Dr. Ali Lowe and determined stable for discharge home. Follow up with our office has been arranged. Medications are listed below. Pertinent changes include addition of plavix.  _____________  Cath/PCI Registry Performance & Quality Measures: Aspirin prescribed? - Yes ADP Receptor Inhibitor (Plavix/Clopidogrel, Brilinta/Ticagrelor or Effient/Prasugrel) prescribed (includes medically managed patients)? - Yes High Intensity Statin (Lipitor 40-80mg  or Crestor 20-40mg ) prescribed? - Yes For EF <40%, was  ACEI/ARB prescribed? - Not Applicable (EF >/= [id number]) For EF <40%, Aldosterone Antagonist (Spironolactone or Eplerenone) prescribed? - Not Applicable (EF >/= [id number]) Cardiac Rehab Phase II ordered (Included Medically managed Patients)? - Yes  _____________   Discharge Vitals Blood pressure 134/78, pulse 66, temperature 98.1  F (36.7 C), temperature source Temporal, resp. rate 18, height 5\' 11"  (1.803 m), weight 98.4 kg, SpO2 97 %.  Filed Weights   08/02/21 0624  Weight: 98.4 kg    Last Labs & Radiologic Studies    CBC Recent Labs    07/31/21 1531  WBC 6.4  HGB 14.1  HCT 39.6  MCV 93  PLT 123456   Basic Metabolic Panel Recent Labs    07/31/21 1531  NA 144  K 4.4  CL 105  CO2 23  GLUCOSE 90  BUN 17  CREATININE 0.77  CALCIUM 9.9   Liver Function Tests No results for input(s): "AST", "ALT", "ALKPHOS", "BILITOT", "PROT", "ALBUMIN" in the last 72 hours. No results for input(s): "LIPASE", "AMYLASE" in the last 72 hours. High Sensitivity Troponin:   No results for input(s): "TROPONINIHS" in the last 720 hours.  BNP Invalid input(s): "POCBNP" D-Dimer No results for input(s): "DDIMER" in the last 72 hours. Hemoglobin A1C No results for input(s): "HGBA1C" in the last 72 hours. Fasting Lipid Panel No results for input(s): "CHOL", "HDL", "LDLCALC", "TRIG", "CHOLHDL", "LDLDIRECT" in the last 72 hours. Thyroid Function Tests No results for input(s): "TSH", "T4TOTAL", "T3FREE", "THYROIDAB" in the last 72 hours.  Invalid input(s): "FREET3" _____________  CARDIAC CATHETERIZATION  Result Date: 08/02/2021   Prox RCA lesion is 10% stenosed.   Prox LAD lesion is 80% stenosed.   A stent was successfully placed.   Post intervention, there is a 0% residual stenosis.   LV end diastolic pressure is normal. 1.  High-grade proximal LAD lesion treated with OCT guided PCI with 1 drug-eluting stent with mild disease elsewhere. 2.  LVEDP of 7 to 9 mmHg. Recommendation: Dual antiplatelet therapy for 6 months and same-day discharge for PCI with cardiology follow-up.   CT CORONARY FRACTIONAL FLOW RESERVE DATA PREP  Result Date: 07/13/2021 EXAM: CT FFR analysis was performed on the original cardiac CTA dataset. Diagrammatic representation of the CT FFR analysis is provided in a separate PDF document in PACS. This  dictation was created using the PDF document and an interactive 3D model of the results. The 3D model is not available in the EMR/PACS. INTERPRETATION: CT FFR provides simultaneous calculation of pressure and flow across the entire coronary tree. For clinical decision making, CT FFR values should be obtained 1-2 cm distal to the lower border of each stenosis measured. Coronary CTA-related artifacts may impair the diagnostic accuracy of the original cardiac CTA and FFR CT results. *Due to the fact that CT FFR represents a mathematically-derived analysis, it is recommended that the results be interpreted as follows: 1. CT FFR >0.80: Low likelihood of hemodynamic significance. 2. CT FFR 0.76-0.80: Borderline likelihood of hemodynamic significance. 3. CT FFR =< 0.75: High likelihood of hemodynamic significance. *Coronary CT Angiography-derived Fractional Flow Reserve Testing in Patients with Stable Coronary Artery Disease: Recommendations on Interpretation and Reporting. Radiology: Cardiothoracic Imaging. 2019;1(5):e190050 FINDINGS: 1. Left Main: 0.99; low likelihood of hemodynamic significance. 2. Proximal LAD: 0.52; high likelihood of hemodynamic significance. 3. LCX: 0.93; low likelihood of hemodynamic significance. 4. RCA: 0.90; low likelihood of hemodynamic significance. IMPRESSION: 1.  Proximal LAD is highly positive by CT FFR (0.52). Eleonore Chiquito, MD Electronically Signed   By: Lake Bells  O'Finch M.D.   On: 07/13/2021 11:42   CT CORONARY MORPH W/CTA COR W/SCORE W/CA W/CM &/OR WO/CM  Addendum Date: 07/13/2021   ADDENDUM REPORT: 07/13/2021 11:37 CLINICAL DATA:  Chest pain EXAM: Cardiac/Coronary CTA TECHNIQUE: A non-contrast, gated CT scan was obtained with axial slices of 3 mm through the heart for calcium scoring. Calcium scoring was performed using the Agatston method. A 120 kV prospective, gated, contrast cardiac scan was obtained. Gantry rotation speed was 250 msecs and collimation was 0.6 mm. Two sublingual  nitroglycerin tablets (0.8 mg) were given. The 3D data set was reconstructed in 5% intervals of the 35-75% of the R-R cycle. Diastolic phases were analyzed on a dedicated workstation using MPR, MIP, and VRT modes. The patient received 95 cc of contrast. FINDINGS: Image quality: Excellent. Noise artifact is: Limited. Coronary Arteries:  Normal coronary origin.  Right dominance. Left main: The left main is a large caliber vessel with a normal take off from the left coronary cusp that bifurcates to form a left anterior descending artery and a left circumflex artery. There is minimal calcified plaque (<25%). Left anterior descending artery: The proximal LAD contains a severe mixed density plaque (70-99%). The mid and distal segments contain minimal mixed density plaque (<25%). The LAD gives off 1 patent diagonal branches. Left circumflex artery: The LCX is non-dominant. The proximal LCX contains minimal calcified plaque (<25%). The mid LCX contains mild non-calcified plaque (25-49%). The distal segment is patent. The LCX gives off 3 patent obtuse marginal branches. Right coronary artery: The RCA is dominant with normal take off from the right coronary cusp. There is minimal calcified plaque (<25%) in the proximal and mid segments. The distal RCA is patent. The RCA terminates as a PDA and right posterolateral branch without evidence of plaque or stenosis. Right Atrium: Right atrial size is within normal limits. Right Ventricle: The right ventricular cavity is within normal limits. Left Atrium: Left atrial size is normal in size with no left atrial appendage filling defect. Left Ventricle: The ventricular cavity size is within normal limits. Pulmonary arteries: Normal in size without proximal filling defect. Pulmonary veins: Normal pulmonary venous drainage. Pericardium: Normal thickness without significant effusion or calcium present. Cardiac valves: The aortic valve is trileaflet without significant calcification. The  mitral valve is normal without significant calcification. Aorta: Normal caliber without significant disease. Extra-cardiac findings: See attached radiology report for non-cardiac structures. IMPRESSION: 1. Coronary calcium score of 325. This was 71st percentile for age-, sex, and race-matched controls. 2. Normal coronary origin with right dominance. 3. Severe mixed density plaque (70-99%) in the proximal LAD. 4. Mild non-calcified plaque (25-49%) in the mid LCX. 5. Minimal calcified plaque (<25%) in the RCA. RECOMMENDATIONS: 1. Severe stenosis in the proximal LAD (70-99%). CT FFR will be submitted. Consider symptom-guided anti-ischemic pharmacotherapy as well as risk factor modification per guideline directed care. Invasive coronary angiography recommended with revascularization per published guideline statements. Lennie Odor, MD Electronically Signed   By: Lennie Odor M.D.   On: 07/13/2021 11:37   Result Date: 07/13/2021 EXAM: OVER-READ INTERPRETATION  CT CHEST The following report is a limited chest CT over-read performed by radiologist Dr. Trudie Reed of [facility] on 07/13/2021. The coronary calcium score and cardiac CTA interpretation by the cardiologist is attached. COMPARISON:  None Available. FINDINGS: Atherosclerotic calcifications in the thoracic aorta. Irregular-shaped 8 x 5 mm nodule in the right middle lobe (axial image 24 of series 12), with some adjacent lucency in the lungs suggesting air trapping.  Within the visualized portions of the thorax there are no other larger more suspicious appearing pulmonary nodules or masses, there is no acute consolidative airspace disease, no pleural effusions, no pneumothorax and no lymphadenopathy. Visualized portions of the upper abdomen are unremarkable. There are no aggressive appearing lytic or blastic lesions noted in the visualized portions of the skeleton. IMPRESSION: 1. 8 x 5 mm right middle lobe nodule with some associated air trapping.  This could represent an area of mucoid impaction within some bronchi, however, close attention on follow-up studies is recommended as the possibility of an endobronchial or intraparenchymal nodule is not excluded. Non-contrast chest CT at 6-12 months is recommended. If the nodule is stable at time of repeat CT, then future CT at 18-24 months (from today's scan) is considered optional for low-risk patients, but is recommended for high-risk patients. This recommendation follows the consensus statement: Guidelines for Management of Incidental Pulmonary Nodules Detected on CT Images: From the Fleischner Society 2017; Radiology 2017; 284:228-243. 2.  Aortic Atherosclerosis (ICD10-I70.0). Electronically Signed: By: Vinnie Langton M.D. On: 07/13/2021 09:57   ECHOCARDIOGRAM COMPLETE  Result Date: 07/11/2021    ECHOCARDIOGRAM REPORT   Patient Name:   Travis Simmons Date of Exam: 07/11/2021 Medical Rec #:  [id number]      Height:       71.0 in Accession #:    [id number]     Weight:       217.4 lb Date of Birth:  Apr 11, 1955      BSA:          2.185 m Patient Age:    36 years       BP:           124/70 mmHg Patient Gender: M              HR:           61 bpm. Exam Location:  [place] Procedure: 2D Echo, 3D Echo, Cardiac Doppler and Color Doppler Indications:    R06.00 Dyspnea  History:        Patient has no prior history of Echocardiogram examinations.                 Signs/Symptoms:Dyspnea; Risk Factors:Family History of Coronary                 Artery Disease and Former Smoker. Obesity.  Sonographer:    Deliah Boston RDCS Referring Phys: Omarrion Carmer K Clever  1. Left ventricular ejection fraction, by estimation, is 65 to 70%. The left ventricle has normal function. The left ventricle has no regional wall motion abnormalities. Left ventricular diastolic parameters were normal.  2. Right ventricular systolic function is normal. The right ventricular size is normal.  3. Left atrial size was severely dilated.   4. Right atrial size was mildly dilated.  5. The mitral valve is normal in structure. Trivial mitral valve regurgitation. No evidence of mitral stenosis.  6. The aortic valve is tricuspid. Aortic valve regurgitation is not visualized. No aortic stenosis is present. Aortic valve area, by VTI measures 2.52 cm. Aortic valve mean gradient measures 7.0 mmHg. Aortic valve Vmax measures 1.86 m/s.  7. The inferior vena cava is normal in size with greater than 50% respiratory variability, suggesting right atrial pressure of 3 mmHg. FINDINGS  Left Ventricle: Left ventricular ejection fraction, by estimation, is 65 to 70%. The left ventricle has normal function. The left ventricle has no regional wall motion abnormalities. The left ventricular internal cavity  size was normal in size. There is  no left ventricular hypertrophy. Left ventricular diastolic parameters were normal. Normal left ventricular filling pressure. Right Ventricle: The right ventricular size is normal. No increase in right ventricular wall thickness. Right ventricular systolic function is normal. Left Atrium: Left atrial size was severely dilated. Right Atrium: Right atrial size was mildly dilated. Pericardium: There is no evidence of pericardial effusion. Mitral Valve: The mitral valve is normal in structure. Trivial mitral valve regurgitation. No evidence of mitral valve stenosis. Tricuspid Valve: The tricuspid valve is normal in structure. Tricuspid valve regurgitation is not demonstrated. No evidence of tricuspid stenosis. Aortic Valve: The aortic valve is tricuspid. Aortic valve regurgitation is not visualized. No aortic stenosis is present. Aortic valve mean gradient measures 7.0 mmHg. Aortic valve peak gradient measures 13.8 mmHg. Aortic valve area, by VTI measures 2.52  cm. Pulmonic Valve: The pulmonic valve was normal in structure. Pulmonic valve regurgitation is not visualized. No evidence of pulmonic stenosis. Aorta: The aortic root is normal in  size and structure. Venous: The inferior vena cava is normal in size with greater than 50% respiratory variability, suggesting right atrial pressure of 3 mmHg. IAS/Shunts: No atrial level shunt detected by color flow Doppler.  LEFT VENTRICLE PLAX 2D LVIDd:         4.65 cm   Diastology LVIDs:         2.80 cm   LV e' medial:    9.57 cm/s LV PW:         0.95 cm   LV E/e' medial:  10.0 LV IVS:        0.80 cm   LV e' lateral:   12.70 cm/s LVOT diam:     2.40 cm   LV E/e' lateral: 7.5 LV SV:         110 LV SV Index:   50 LVOT Area:     4.52 cm                           3D Volume EF:                          3D EF:        62 %                          LV EDV:       132 ml                          LV ESV:       51 ml                          LV SV:        81 ml RIGHT VENTRICLE RV Basal diam:  4.60 cm RV Mid diam:    3.80 cm RV S prime:     16.50 cm/s TAPSE (M-mode): 3.1 cm LEFT ATRIUM              Index        RIGHT ATRIUM           Index LA diam:        4.10 cm  1.88 cm/m   RA Area:     21.50 cm LA Vol (A2C):   50.2 ml  22.98 ml/m  RA Volume:   66.10 ml  30.26 ml/m LA Vol (A4C):   101.0 ml 46.23 ml/m LA Biplane Vol: 72.8 ml  33.32 ml/m  AORTIC VALVE AV Area (Vmax):    2.75 cm AV Area (Vmean):   2.76 cm AV Area (VTI):     2.52 cm AV Vmax:           186.00 cm/s AV Vmean:          123.000 cm/s AV VTI:            0.436 m AV Peak Grad:      13.8 mmHg AV Mean Grad:      7.0 mmHg LVOT Vmax:         113.00 cm/s LVOT Vmean:        74.967 cm/s LVOT VTI:          0.243 m LVOT/AV VTI ratio: 0.56  AORTA Ao Root diam: 2.80 cm Ao Asc diam:  3.25 cm MITRAL VALVE MV Area (PHT): cm         SHUNTS MV Decel Time: 224 msec    Systemic VTI:  0.24 m MV E velocity: 95.40 cm/s  Systemic Diam: 2.40 cm MV A velocity: 81.00 cm/s MV E/A ratio:  1.18 Skeet Latch MD Electronically signed by Skeet Latch MD Signature Date/Time: 07/11/2021/7:27:48 PM    Final     Disposition   Pt is being discharged home today in good  condition.  Follow-up Plans & Appointments     Follow-up Information     Early Osmond, MD Follow up on 08/30/2021.   Specialty: Cardiology Why: at 3pm for your follow up appt Contact information: [address] [place] [address] [phone]                Discharge Instructions     AMB Referral to Cardiac Rehabilitation - Phase II   Complete by: As directed    Diagnosis: Coronary Stents   After initial evaluation and assessments completed: Virtual Based Care may be provided alone or in conjunction with Phase 2 Cardiac Rehab based on patient barriers.: Yes        Discharge Medications   Allergies as of 08/02/2021       Reactions   Aleve [naproxen] Rash        Medication List     STOP taking these medications    isosorbide mononitrate 30 MG 24 hr tablet Commonly known as: IMDUR       TAKE these medications    albuterol 108 (90 Base) MCG/ACT inhaler Commonly known as: VENTOLIN HFA Inhale 2 puffs into the lungs every 4 (four) hours as needed for wheezing or shortness of breath.   Aspirin Low Dose 81 MG tablet Generic drug: aspirin EC Take 1 tablet (81 mg total) by mouth daily. Swallow whole.   atorvastatin 40 MG tablet Commonly known as: LIPITOR Take 1 tablet (40 mg total) by mouth daily.   budesonide-formoterol 160-4.5 MCG/ACT inhaler Commonly known as: SYMBICORT Inhale 2 puffs into the lungs 2 (two) times daily.   clopidogrel 75 MG tablet Commonly known as: Plavix Take 1 tablet (75 mg total) by mouth daily.   meloxicam 15 MG tablet Commonly known as: MOBIC Take 15 mg by mouth daily.   metoprolol succinate 25 MG 24 hr tablet Commonly known as: Toprol XL Take 0.5 tablets (12.5 mg total) by mouth at bedtime.   multivitamin with minerals Tabs tablet Take 1 tablet by mouth in the  morning.   nitroGLYCERIN 0.4 MG SL tablet Commonly known as: NITROSTAT Place 1 tablet (0.4 mg total) under the tongue every 5 (five) minutes  as needed for chest pain. x 3 doses per chest pain episode.  Call 911 if third dose is taken.   Voltaren 1 % Gel Generic drug: diclofenac Sodium Apply 1 application  topically 4 (four) times daily as needed (arthritis pain (hands)).           Allergies Allergies  Allergen Reactions   Aleve [Naproxen] Rash    Outstanding Labs/Studies   N/a   Duration of Discharge Encounter   Greater than 30 minutes including physician time.  Signed, Laverda Page, NP 08/02/2021, 1:31 PM   ATTENDING ATTESTATION:  After conducting a review of all available clinical information with the care team, interviewing the patient, and performing a physical exam, I agree with the findings and plan described in this note.   GEN: No acute distress.   HEENT:  MMM, no JVD, no scleral icterus Cardiac: RRR, no murmurs, rubs, or gallops.  Respiratory: Clear to auscultation bilaterally. GI: Soft, nontender, non-distended  MS: No edema; No deformity. Neuro:  Nonfocal  Vasc:  Zephyr band in place, mild bruising, no hematoma  The patient is a 66 year old male with a history of asthma, prior tobacco abuse, and GERD who was evaluated in the outpatient setting due to exertional angina.  He was started on appropriate medical therapy with 2 antianginal agents.  Coronary CTA demonstrated prognostically important proximal LAD disease and he was referred for coronary angiography.  He underwent PCI of the proximal LAD with OCT guidance.  He is stable and doing well.  He will be discharged through our same-day protocol.  He has a Plavix prescription and should continue DAPT for at least 6 months.  We will arrange expedited follow-up with him prior to bus trip on June 29.  Stable for discharge.   Alverda Skeans, MD Pager [phone]

## 2021-08-02 NOTE — Progress Notes (Signed)
Seen pt from 1300-1334 pt was educated on stent procedure, restrictions, Aspirin & Plavix med use, ex guidelines, NTG use, heart diet, and CRPII. Pt is being referred to [facility] in [place].   Travis Simmons 08/02/2021 1:36 PM

## 2021-08-02 NOTE — Progress Notes (Addendum)
Laverda Page, PA in and ok to d/c home. Right arm bruised; good radial pulse

## 2021-08-02 NOTE — Interval H&P Note (Signed)
History and Physical Interval Note:  08/02/2021 7:09 AM  Susy Frizzle  has presented today for surgery, with the diagnosis of dyspnea, chest pain, abnormal CT.  The various methods of treatment have been discussed with the patient and family. After consideration of risks, benefits and other options for treatment, the patient has consented to  Procedure(s): LEFT HEART CATH AND CORONARY ANGIOGRAPHY (N/A) as a surgical intervention.  The patient's history has been reviewed, patient examined, no change in status, stable for surgery.  I have reviewed the patient's chart and labs.  Questions were answered to the patient's satisfaction.    Cath Lab Visit (complete for each Cath Lab visit)  Clinical Evaluation Leading to the Procedure:   ACS: No.  Non-ACS:    Anginal Classification: CCS II  Anti-ischemic medical therapy: Maximal Therapy (2 or more classes of medications)  Non-Invasive Test Results: High-risk stress test findings: cardiac mortality >3%/year  Prior CABG: No previous CABG        Orbie Pyo

## 2021-08-02 NOTE — Progress Notes (Signed)
On arrival from cath lab, hematoma noted 6cm x 4cm and Scott Cooke,RN notified of hematoma and he held pressure x and site softer; Dr Lynnette Caffey notified and in to check radial site

## 2021-08-03 ENCOUNTER — Encounter (HOSPITAL_COMMUNITY): Payer: Self-pay | Admitting: Internal Medicine

## 2021-08-03 ENCOUNTER — Telehealth (HOSPITAL_COMMUNITY): Payer: Self-pay

## 2021-08-03 MED FILL — Nitroglycerin IV Soln 100 MCG/ML in D5W: INTRA_ARTERIAL | Qty: 10 | Status: AC

## 2021-08-03 NOTE — Telephone Encounter (Signed)
Pt is not interested in the cardiac rehab program. Closed referral 

## 2021-08-03 NOTE — Progress Notes (Signed)
Cardiology Office Note    Date:  08/09/2021   ID:  Travis Simmons, Travis Simmons 1955-04-05, MRN [id number]   PCP:  [name], MD   [facility]  Cardiologist:  Orbie Pyo, MD   Advanced Practice Provider:  No care team member to display Electrophysiologist:  None   [phone]   Chief Complaint  Patient presents with   Hospitalization Follow-up    History of Present Illness:  Travis Simmons is a 66 y.o. male with a hx of asthma, aortic atherosclerosis on previous CT, GERD and prediabetes who was recently seen on 06/22/2021 for chest tightness and dyspnea on exertion.  He has family history of CAD as well.   Echocardiogram obtained on 07/11/2021 demonstrated EF 65 to 70%, no regional wall motion abnormality, trivial MR.  Coronary CT obtained on 07/13/2021 demonstrated 70 to 99% stenosis in proximal LAD, 25 to 49% mid LAD lesion, less than 25% disease in proximal to mid RCA.  Coronary calcium score 325 which placed the patient on 71st percentile for age and sex matched control.  Patient does have 8 x 5 mm right middle lobe lung nodule, repeat noncontrast CT of chest in 6 to 12 months was recommended.  FFR was positive in proximal LAD at 0.52.  Patient was placed on aspirin, Lipitor, Imdur and Toprol-XL.    He underwent DES pLAD 08/02/21, residual 10% RCA.   Patient comes in for f/u with his wife. Denies any further chest pain, dyspnea. Walking 3/10 mile 3-4 times a week. Works as a Music therapist. Plays golf 3 days a week. Going on a bus trip to [place] tomorrow.  Past Medical History:  Diagnosis Date   Arthritis    Asthma    Atherosclerosis of aorta (HCC)    GERD (gastroesophageal reflux disease)    hx of    Gout    Obesity    Osteoarthritis    Paresthesia    Pre-diabetes     Past Surgical History:  Procedure Laterality Date   CORONARY STENT INTERVENTION N/A 08/02/2021   Procedure: CORONARY STENT INTERVENTION;  Surgeon: Orbie Pyo, MD;  Location: [facility]  INVASIVE CV LAB;  Service: Cardiovascular;  Laterality: N/A;  Prox LAD   INTRAVASCULAR IMAGING/OCT N/A 08/02/2021   Procedure: INTRAVASCULAR IMAGING/OCT;  Surgeon: Orbie Pyo, MD;  Location: [facility] INVASIVE CV LAB;  Service: Cardiovascular;  Laterality: N/A;  LAD   LEFT HEART CATH AND CORONARY ANGIOGRAPHY N/A 08/02/2021   Procedure: LEFT HEART CATH AND CORONARY ANGIOGRAPHY;  Surgeon: Orbie Pyo, MD;  Location: [facility] INVASIVE CV LAB;  Service: Cardiovascular;  Laterality: N/A;   left knee arthroscopy      REVERSE SHOULDER ARTHROPLASTY Right 01/01/2020   Procedure: REVERSE SHOULDER ARTHROPLASTY;  Surgeon: Beverely Low, MD;  Location: [facility] ORS;  Service: Orthopedics;  Laterality: Right;  interscalene block   TOTAL SHOULDER ARTHROPLASTY Left 03/01/2017   TOTAL SHOULDER ARTHROPLASTY Left 03/01/2017   Procedure: LEFT SHOULDER ANATOMIC TOTAL SHOULDER ARTHROPLASTY;  Surgeon: Beverely Low, MD;  Location: [facility] OR;  Service: Orthopedics;  Laterality: Left;    Current Medications: Current Meds  Medication Sig   albuterol (PROVENTIL HFA;VENTOLIN HFA) 108 (90 Base) MCG/ACT inhaler Inhale 2 puffs into the lungs every 4 (four) hours as needed for wheezing or shortness of breath.   aspirin EC 81 MG tablet Take 1 tablet (81 mg total) by mouth daily. Swallow whole.   atorvastatin (LIPITOR) 40 MG tablet Take 1 tablet (40 mg total) by mouth  daily.   budesonide-formoterol (SYMBICORT) 160-4.5 MCG/ACT inhaler Inhale 2 puffs into the lungs 2 (two) times daily.   clopidogrel (PLAVIX) 75 MG tablet Take 1 tablet (75 mg total) by mouth daily.   diclofenac Sodium (VOLTAREN) 1 % GEL Apply 1 application  topically 4 (four) times daily as needed (arthritis pain (hands)).   meloxicam (MOBIC) 15 MG tablet Take 15 mg by mouth daily.   metoprolol succinate (TOPROL XL) 25 MG 24 hr tablet Take 0.5 tablets (12.5 mg total) by mouth at bedtime.   Multiple Vitamin (MULTIVITAMIN WITH MINERALS) TABS tablet Take 1 tablet by mouth in the  morning.   nitroGLYCERIN (NITROSTAT) 0.4 MG SL tablet Place 1 tablet (0.4 mg total) under the tongue every 5 (five) minutes as needed for chest pain. x 3 doses per chest pain episode.  Call 911 if third dose is taken.     Allergies:   Aleve [naproxen]   Social History   Socioeconomic History   Marital status: Married    Spouse name: Not on file   Number of children: Not on file   Years of education: Not on file   Highest education level: Not on file  Occupational History   Not on file  Tobacco Use   Smoking status: Former   Smokeless tobacco: Never   Tobacco comments:    QUIT IN 2004  Vaping Use   Vaping Use: Never used  Substance and Sexual Activity   Alcohol use: No    Comment: no etoh in 18 years    Drug use: No   Sexual activity: Not on file  Other Topics Concern   Not on file  Social History Narrative   Not on file   Social Determinants of Health   Financial Resource Strain: Not on file  Food Insecurity: Not on file  Transportation Needs: Not on file  Physical Activity: Not on file  Stress: Not on file  Social Connections: Not on file     Family History:  The patient's  family history includes Heart attack (age of onset: 14) in his brother; Heart attack (age of onset: 62) in his father.   ROS:   Please see the history of present illness.    ROS All other systems reviewed and are negative.   PHYSICAL EXAM:   VS:  BP 112/62   Pulse 69   Ht 5\' 11"  (1.803 m)   Wt 210 lb 6.4 oz (95.4 kg)   SpO2 95%   BMI 29.34 kg/m   Physical Exam  GEN: Well nourished, well developed, in no acute distress  Neck: no JVD, carotid bruits, or masses Cardiac:RRR; no murmurs, rubs, or gallops  Respiratory:  clear to auscultation bilaterally, normal work of breathing GI: soft, nontender, nondistended, + BS Ext: bruising right arm and small knot at cath site, good radial and brachial pulses, lower ext without cyanosis, clubbing, or edema, Good distal pulses bilaterally Neuro:   Alert and Oriented x 3,  Psych: euthymic mood, full affect  Wt Readings from Last 3 Encounters:  08/09/21 210 lb 6.4 oz (95.4 kg)  08/02/21 217 lb (98.4 kg)  07/31/21 214 lb 14.4 oz (97.5 kg)      Studies/Labs Reviewed:   EKG:  EKG is not ordered today.    Recent Labs: 07/31/2021: BUN 17; Creatinine, Ser 0.77; Hemoglobin 14.1; Platelets 203; Potassium 4.4; Sodium 144   Lipid Panel No results found for: "CHOL", "TRIG", "HDL", "CHOLHDL", "VLDL", "LDLCALC", "LDLDIRECT"  Additional studies/ records that were reviewed  today include:  Cardiac Catheterization 08/02/2021:     Prox RCA lesion is 10% stenosed.   Prox LAD lesion is 80% stenosed.   A stent was successfully placed.   Post intervention, there is a 0% residual stenosis.   LV end diastolic pressure is normal.   1.  High-grade proximal LAD lesion treated with OCT guided PCI with 1 drug-eluting stent with mild disease elsewhere. 2.  LVEDP of 7 to 9 mmHg.   Recommendation: Dual antiplatelet therapy for 6 months and same-day discharge for PCI with cardiology follow-up.   Diagnostic Dominance: Right  Intervention    _____________    1. CT FFR >0.80: Low likelihood of hemodynamic significance. 2. CT FFR 0.76-0.80: Borderline likelihood of hemodynamic significance. 3. CT FFR =< 0.75: High likelihood of hemodynamic significance.   *Coronary CT Angiography-derived Fractional Flow Reserve Testing in Patients with Stable Coronary Artery Disease: Recommendations on Interpretation and Reporting. Radiology: Cardiothoracic Imaging. 2019;1(5):e190050   FINDINGS: 1. Left Main: 0.99; low likelihood of hemodynamic significance.   2. Proximal LAD: 0.52; high likelihood of hemodynamic significance. 3. LCX: 0.93; low likelihood of hemodynamic significance. 4. RCA: 0.90; low likelihood of hemodynamic significance.   IMPRESSION:   1.  Proximal LAD is highly positive by CT FFR (0.52).   Lennie Odor, MD     Electronically  Signed   By: Lennie Odor M.D.   On: 07/13/2021 11:42  Echo 07/11/21 IMPRESSIONS     1. Left ventricular ejection fraction, by estimation, is 65 to 70%. The  left ventricle has normal function. The left ventricle has no regional  wall motion abnormalities. Left ventricular diastolic parameters were  normal.   2. Right ventricular systolic function is normal. The right ventricular  size is normal.   3. Left atrial size was severely dilated.   4. Right atrial size was mildly dilated.   5. The mitral valve is normal in structure. Trivial mitral valve  regurgitation. No evidence of mitral stenosis.   6. The aortic valve is tricuspid. Aortic valve regurgitation is not  visualized. No aortic stenosis is present. Aortic valve area, by VTI  measures 2.52 cm. Aortic valve mean gradient measures 7.0 mmHg. Aortic  valve Vmax measures 1.86 m/s.   7. The inferior vena cava is normal in size with greater than 50%  respiratory variability, suggesting right atrial pressure of 3 mmHg.   FINDINGS   Left Ventricle: Left ventricular ejection fraction, by estimation, is 65  to 70%. The left ventricle has normal function. The left ventricle has no  regional wall motion abnormalities. The left ventricular internal cavity  size was normal in size. There is   no left ventricular hypertrophy. Left ventricular diastolic parameters  were normal. Normal left ventricular filling pressure.   Right Ventricle: The right ventricular size is normal. No increase in  right ventricular wall thickness. Right ventricular systolic function is  normal.   Left Atrium: Left atrial size was severely dilated.   Right Atrium: Right atrial size was mildly dilated.   Pericardium: There is no evidence of pericardial effusion.   Mitral Valve: The mitral valve is normal in structure. Trivial mitral  valve regurgitation. No evidence of mitral valve stenosis.   Tricuspid Valve: The tricuspid valve is normal in structure.  Tricuspid  valve regurgitation is not demonstrated. No evidence of tricuspid  stenosis.   Aortic Valve: The aortic valve is tricuspid. Aortic valve regurgitation is  not visualized. No aortic stenosis is present. Aortic valve mean gradient  measures  7.0 mmHg. Aortic valve peak gradient measures 13.8 mmHg. Aortic  valve area, by VTI measures 2.52   cm.   Pulmonic Valve: The pulmonic valve was normal in structure. Pulmonic valve  regurgitation is not visualized. No evidence of pulmonic stenosis.   Aorta: The aortic root is normal in size and structure.   Venous: The inferior vena cava is normal in size with greater than 50%  respiratory variability, suggesting right atrial pressure of 3 mmHg.   IAS/Shunts: No atrial level shunt detected by color flow Doppler.     LEFT VENTRICLE   Risk Assessment/Calculations:         ASSESSMENT:    1. Coronary artery disease involving native coronary artery of native heart without angina pectoris   2. Prediabetes   3. Family history of early CAD   4. Hyperlipidemia, unspecified hyperlipidemia type      PLAN:  In order of problems listed above:  CAD DES pLAD 08/02/21 no angina, no bleeding problems on plavix and ASA. Bruising/knot at cath site. Limit heavy lifting until it resolves.  Prediabetes-diet and exercise  Family history of CAD  HLD-no lipids on file. On lipitor and scheduled for FLP 08/22/21  Shared Decision Making/Informed Consent        Medication Adjustments/Labs and Tests Ordered: Current medicines are reviewed at length with the patient today.  Concerns regarding medicines are outlined above.  Medication changes, Labs and Tests ordered today are listed in the Patient Instructions below. Patient Instructions  Medication Instructions:  Your physician recommends that you continue on your current medications as directed. Please refer to the Current Medication list given to you today.  *If you need a refill on your  cardiac medications before your next appointment, please call your pharmacy*   Lab Work: None If you have labs (blood work) drawn today and your tests are completely normal, you will receive your results only by: MyChart Message (if you have MyChart) OR A paper copy in the mail If you have any lab test that is abnormal or we need to change your treatment, we will call you to review the results.   Follow-Up: At [facility], you and your health needs are our priority.  As part of our continuing mission to provide you with exceptional heart care, we have created designated Provider Care Teams.  These Care Teams include your primary Cardiologist (physician) and Advanced Practice Providers (APPs -  Physician Assistants and Nurse Practitioners) who all work together to provide you with the care you need, when you need it.  We recommend signing up for the patient portal called "MyChart".  Sign up information is provided on this After Visit Summary.  MyChart is used to connect with patients for Virtual Visits (Telemedicine).  Patients are able to view lab/test results, encounter notes, upcoming appointments, etc.  Non-urgent messages can be sent to your provider as well.   To learn more about what you can do with MyChart, go to [email or web address].    Your next appointment:   As scheduled     Signed, Jacolyn Reedy, PA-C  08/09/2021 8:24 AM    [facility] [address] [place], [place], [place]  [address] Phone: [phone]; Fax: [phone]

## 2021-08-09 ENCOUNTER — Ambulatory Visit: Payer: Medicare HMO | Admitting: Physician Assistant

## 2021-08-09 ENCOUNTER — Encounter: Payer: Self-pay | Admitting: Physician Assistant

## 2021-08-09 VITALS — BP 112/62 | HR 69 | Ht 71.0 in | Wt 210.4 lb

## 2021-08-09 DIAGNOSIS — I2510 Atherosclerotic heart disease of native coronary artery without angina pectoris: Secondary | ICD-10-CM | POA: Diagnosis not present

## 2021-08-09 DIAGNOSIS — R7303 Prediabetes: Secondary | ICD-10-CM

## 2021-08-09 DIAGNOSIS — E785 Hyperlipidemia, unspecified: Secondary | ICD-10-CM

## 2021-08-09 DIAGNOSIS — Z8249 Family history of ischemic heart disease and other diseases of the circulatory system: Secondary | ICD-10-CM | POA: Diagnosis not present

## 2021-08-09 NOTE — Patient Instructions (Signed)
Medication Instructions:  Your physician recommends that you continue on your current medications as directed. Please refer to the Current Medication list given to you today.  *If you need a refill on your cardiac medications before your next appointment, please call your pharmacy*   Lab Work: None If you have labs (blood work) drawn today and your tests are completely normal, you will receive your results only by: MyChart Message (if you have MyChart) OR A paper copy in the mail If you have any lab test that is abnormal or we need to change your treatment, we will call you to review the results.   Follow-Up: At [facility], you and your health needs are our priority.  As part of our continuing mission to provide you with exceptional heart care, we have created designated Provider Care Teams.  These Care Teams include your primary Cardiologist (physician) and Advanced Practice Providers (APPs -  Physician Assistants and Nurse Practitioners) who all work together to provide you with the care you need, when you need it.  We recommend signing up for the patient portal called "MyChart".  Sign up information is provided on this After Visit Summary.  MyChart is used to connect with patients for Virtual Visits (Telemedicine).  Patients are able to view lab/test results, encounter notes, upcoming appointments, etc.  Non-urgent messages can be sent to your provider as well.   To learn more about what you can do with MyChart, go to [email or web address].    Your next appointment:   As scheduled 

## 2021-08-14 ENCOUNTER — Encounter: Payer: Self-pay | Admitting: Internal Medicine

## 2021-08-22 ENCOUNTER — Other Ambulatory Visit: Payer: Medicare HMO

## 2021-08-22 DIAGNOSIS — I700 Atherosclerosis of aorta: Secondary | ICD-10-CM

## 2021-08-23 LAB — LIPID PANEL
Chol/HDL Ratio: 2.5 ratio (ref 0.0–5.0)
Cholesterol, Total: 131 mg/dL (ref 100–199)
HDL: 52 mg/dL (ref >39)
LDL Chol Calc (NIH): 62 mg/dL (ref 0–99)
Triglycerides: 89 mg/dL (ref 0–149)
VLDL Cholesterol Cal: 17 mg/dL (ref 5–40)

## 2021-08-23 LAB — HEPATIC FUNCTION PANEL
ALT: 25 IU/L (ref 0–44)
AST: 16 IU/L (ref 0–40)
Albumin: 4.6 g/dL (ref 3.9–4.9)
Alkaline Phosphatase: 85 IU/L (ref 44–121)
Bilirubin Total: 1.4 mg/dL — ABNORMAL HIGH (ref 0.0–1.2)
Bilirubin, Direct: 0.34 mg/dL (ref 0.00–0.40)
Total Protein: 6.7 g/dL (ref 6.0–8.5)

## 2021-08-23 LAB — LIPOPROTEIN A (LPA): Lipoprotein (a): <8.4 nmol/L (ref <75.0)

## 2021-08-27 NOTE — Progress Notes (Unsigned)
Cardiology Office Note:    Date:  08/30/2021   ID:  Travis Simmons, DOB Apr 21, 1955, MRN [id number]  PCP:  [name], MD   [facility] Providers Cardiologist:  Alverda Skeans, MD Referring MD: [name], MD   Chief Complaint/Reason for Referral: Dyspnea on exertion, chest tightness  ASSESSMENT:    1. Coronary artery disease involving native coronary artery of native heart without angina pectoris   2. Aortic atherosclerosis (HCC)   3. Hyperlipidemia LDL goal <70      PLAN:    In order of problems listed above: 1.  Coronary artery disease: DAPT unitl January 2024 then stop aspirin and continue Plavix monotherapy indefinitely.  Continue statin and beta-blocker.  Follow-up in 1 year or earlier if needed. 2.  Aortic atherosclerosis: Continue aspirin, statin, and strict blood pressure control. 3.  Hyperlipidemia: Lipid panel in July 2023 demonstrated an LDL of 62 and the LP(a) was not elevated.   Dispo:  Return in about 1 year (around 08/31/2022).      Medication Adjustments/Labs and Tests Ordered: Current medicines are reviewed at length with the patient today.  Concerns regarding medicines are outlined above.  The following changes have been made:     Labs/tests ordered: No orders of the defined types were placed in this encounter.   Medication Changes: Meds ordered this encounter  Medications   aspirin EC 81 MG tablet    Sig: Take 1 tablet (81 mg total) by mouth daily. Swallow whole.    Dispense:  30 tablet    Refill:  0     Current medicines are reviewed at length with the patient today.  The patient does not have concerns regarding medicines.   History of Present Illness:    FOCUSED PROBLEM LIST:   1.  Asthma 2.  Aortic atherosclerosis on CT 2021 3.  GERD 4.  Coronary artery disease status post PCI of proximal LAD June 2023 5.  Prior 45 p-y history  May 2023: Patient seen for initial consultation regarding chest pain.  He was referred for  coronary CTA which demonstrated high-grade proximal LAD stenosis.  He was started on 2 antianginal agents.  He underwent cardiac catheterization and PCI which was uncomplicated in early June and was discharged on dual antiplatelet therapy with Plavix.  He was seen in follow-up last month and was doing quite well.  Today: The patient is doing very well.  He recently completed a trip to know his ARC in [place].  This consisted of a lot of walking.  He did not develop angina, exertional dyspnea, presyncope, or syncope.  He has had no severe bleeding or bruising while on dual antiplatelet therapy.  He feels very well and is without significant complaints today.         Current Medications: Current Meds  Medication Sig   albuterol (PROVENTIL HFA;VENTOLIN HFA) 108 (90 Base) MCG/ACT inhaler Inhale 2 puffs into the lungs every 4 (four) hours as needed for wheezing or shortness of breath.   atorvastatin (LIPITOR) 40 MG tablet Take 1 tablet (40 mg total) by mouth daily.   budesonide-formoterol (SYMBICORT) 160-4.5 MCG/ACT inhaler Inhale 2 puffs into the lungs 2 (two) times daily.   clopidogrel (PLAVIX) 75 MG tablet Take 1 tablet (75 mg total) by mouth daily.   diclofenac Sodium (VOLTAREN) 1 % GEL Apply 1 application  topically 4 (four) times daily as needed (arthritis pain (hands)).   meloxicam (MOBIC) 15 MG tablet Take 15 mg by mouth daily.   metoprolol  succinate (TOPROL XL) 25 MG 24 hr tablet Take 0.5 tablets (12.5 mg total) by mouth at bedtime.   Multiple Vitamin (MULTIVITAMIN WITH MINERALS) TABS tablet Take 1 tablet by mouth in the morning.   nitroGLYCERIN (NITROSTAT) 0.4 MG SL tablet Place 1 tablet (0.4 mg total) under the tongue every 5 (five) minutes as needed for chest pain. x 3 doses per chest pain episode.  Call 911 if third dose is taken.   [DISCONTINUED] aspirin EC 81 MG tablet Take 1 tablet (81 mg total) by mouth daily. Swallow whole.     Allergies:    Aleve [naproxen]   Social History:    Social History   Tobacco Use   Smoking status: Former   Smokeless tobacco: Never   Tobacco comments:    QUIT IN 2004  Vaping Use   Vaping Use: Never used  Substance Use Topics   Alcohol use: No    Comment: no etoh in 18 years    Drug use: No     Family Hx: Family History  Problem Relation Age of Onset   Heart attack Father 67       died MI 61   Heart attack Brother 30       CABG 54     Review of Systems:   Please see the history of present illness.    All other systems reviewed and are negative.     EKGs/Labs/Other Test Reviewed:    EKG: EKG performed 2018 demonstrates sinus bradycardia.  EKG performed today that I personally reviewed demonstrates normal sinus rhythm with sinus arrhythmia.  Prior CV studies:  Coronary angiography June 2023 with high-grade proximal LAD lesion treated with 1 drug-eluting stent with mild disease of the right coronary artery with LVEDP of 7 to 9 mmHg  TTE 2023 with ejection fraction 65 to 70% with no significant valvular abnormalities   Other studies Reviewed: Review of the additional studies/records demonstrates: CT shoulder 2021 demonstrates aortic atherosclerosis  Recent Labs: Labs from July 2023 LDL of 62, normal LFTs, and a LP(a) that was not elevated.  Risk Assessment/Calculations:          Physical Exam:    VS:  BP 130/78   Pulse 60   Ht 5\' 11"  (1.803 m)   Wt 211 lb (95.7 kg)   BMI 29.43 kg/m    Wt Readings from Last 3 Encounters:  08/30/21 211 lb (95.7 kg)  08/09/21 210 lb 6.4 oz (95.4 kg)  08/02/21 217 lb (98.4 kg)    GENERAL:  No apparent distress, AOx3 HEENT:  No carotid bruits, +2 carotid impulses, no scleral icterus CAR: RRR no murmurs, gallops, rubs, or thrills RES:  Clear to auscultation bilaterally ABD:  Soft, nontender, nondistended, positive bowel sounds x 4 VASC:  +2 radial pulses, +2 carotid pulses, palpable pedal pulses NEURO:  CN 2-12 grossly intact; motor and sensory grossly intact PSYCH:   No active depression or anxiety EXT:  No edema, ecchymosis, or cyanosis  Signed, 08/04/21, MD  08/30/2021 3:42 PM    [facility] [address] [place], [place], [place]  [place] Phone: [phone]; Fax: [phone]   Note:  This document was prepared using Dragon voice recognition software and may include unintentional dictation errors.

## 2021-08-29 ENCOUNTER — Other Ambulatory Visit (HOSPITAL_COMMUNITY): Payer: Self-pay

## 2021-08-30 ENCOUNTER — Encounter: Payer: Self-pay | Admitting: Internal Medicine

## 2021-08-30 ENCOUNTER — Ambulatory Visit: Payer: Medicare HMO | Admitting: Internal Medicine

## 2021-08-30 VITALS — BP 130/78 | HR 60 | Ht 71.0 in | Wt 211.0 lb

## 2021-08-30 DIAGNOSIS — I700 Atherosclerosis of aorta: Secondary | ICD-10-CM | POA: Diagnosis not present

## 2021-08-30 DIAGNOSIS — I2510 Atherosclerotic heart disease of native coronary artery without angina pectoris: Secondary | ICD-10-CM | POA: Diagnosis not present

## 2021-08-30 DIAGNOSIS — E785 Hyperlipidemia, unspecified: Secondary | ICD-10-CM

## 2021-08-30 MED ORDER — ASPIRIN 81 MG PO TBEC: 81.0000 mg | DELAYED_RELEASE_TABLET | Freq: Every day | ORAL | 0 refills | Status: DC

## 2021-08-30 NOTE — Patient Instructions (Signed)
Medication Instructions:  1.) stop aspirin Feb 12 2022.  *If you need a refill on your cardiac medications before your next appointment, please call your pharmacy*   Lab Work: none If you have labs (blood work) drawn today and your tests are completely normal, you will receive your results only by: MyChart Message (if you have MyChart) OR A paper copy in the mail If you have any lab test that is abnormal or we need to change your treatment, we will call you to review the results.   Testing/Procedures: none   Follow-Up: At [facility], you and your health needs are our priority.  As part of our continuing mission to provide you with exceptional heart care, we have created designated Provider Care Teams.  These Care Teams include your primary Cardiologist (physician) and Advanced Practice Providers (APPs -  Physician Assistants and Nurse Practitioners) who all work together to provide you with the care you need, when you need it.  We recommend signing up for the patient portal called "MyChart".  Sign up information is provided on this After Visit Summary.    Your next appointment:   12 month(s)  The format for your next appointment:   In Person  Provider:   Orbie Pyo, MD    Important Information About Sugar

## 2021-09-22 ENCOUNTER — Other Ambulatory Visit: Payer: Self-pay | Admitting: Internal Medicine

## 2021-10-12 DIAGNOSIS — R7303 Prediabetes: Secondary | ICD-10-CM | POA: Diagnosis not present

## 2021-10-12 DIAGNOSIS — Z0000 Encounter for general adult medical examination without abnormal findings: Secondary | ICD-10-CM | POA: Diagnosis not present

## 2021-10-12 DIAGNOSIS — Z79899 Other long term (current) drug therapy: Secondary | ICD-10-CM | POA: Diagnosis not present

## 2021-10-12 DIAGNOSIS — I700 Atherosclerosis of aorta: Secondary | ICD-10-CM | POA: Diagnosis not present

## 2021-10-12 DIAGNOSIS — M19041 Primary osteoarthritis, right hand: Secondary | ICD-10-CM | POA: Diagnosis not present

## 2021-10-12 DIAGNOSIS — I2510 Atherosclerotic heart disease of native coronary artery without angina pectoris: Secondary | ICD-10-CM | POA: Diagnosis not present

## 2021-10-12 DIAGNOSIS — J45909 Unspecified asthma, uncomplicated: Secondary | ICD-10-CM | POA: Diagnosis not present

## 2021-10-12 DIAGNOSIS — E785 Hyperlipidemia, unspecified: Secondary | ICD-10-CM | POA: Diagnosis not present

## 2022-04-10 ENCOUNTER — Other Ambulatory Visit: Payer: Self-pay | Admitting: Internal Medicine

## 2022-05-11 ENCOUNTER — Other Ambulatory Visit: Payer: Self-pay | Admitting: Internal Medicine

## 2022-06-08 ENCOUNTER — Other Ambulatory Visit: Payer: Self-pay | Admitting: Internal Medicine

## 2022-07-24 DIAGNOSIS — M180 Bilateral primary osteoarthritis of first carpometacarpal joints: Secondary | ICD-10-CM | POA: Diagnosis not present

## 2022-07-30 DIAGNOSIS — G5603 Carpal tunnel syndrome, bilateral upper limbs: Secondary | ICD-10-CM | POA: Diagnosis not present

## 2022-08-02 DIAGNOSIS — M180 Bilateral primary osteoarthritis of first carpometacarpal joints: Secondary | ICD-10-CM | POA: Diagnosis not present

## 2022-08-02 DIAGNOSIS — G5603 Carpal tunnel syndrome, bilateral upper limbs: Secondary | ICD-10-CM | POA: Diagnosis not present

## 2022-08-07 DIAGNOSIS — G5602 Carpal tunnel syndrome, left upper limb: Secondary | ICD-10-CM | POA: Diagnosis not present

## 2022-08-21 DIAGNOSIS — G5602 Carpal tunnel syndrome, left upper limb: Secondary | ICD-10-CM | POA: Diagnosis not present

## 2022-09-14 ENCOUNTER — Ambulatory Visit: Payer: Medicare HMO | Admitting: Internal Medicine

## 2022-09-19 NOTE — Progress Notes (Signed)
Cardiology Office Note:   Date:  09/26/2022  ID:  Travis Simmons, DOB 08-07-1955, MRN [id number] PCP:  [name], MD  [facility] Providers Cardiologist:  Alverda Skeans, MD Referring MD: [name], MD   Chief Complaint/Reason for Referral: Cardiology follow-up ASSESSMENT:    1. Coronary artery disease involving native coronary artery of native heart without angina pectoris   2. Aortic atherosclerosis (HCC)   3. Hyperlipidemia LDL goal <70   4. BMI 29.0-29.9,adult   5. Primary hypertension     PLAN:   In order of problems listed above: 1.  Coronary artery disease: Continue Plavix monotherapy, atorvastatin, and stop beta-blocker as patient's ejection fraction is normal.  Continue as needed nitroglycerin. 2.  Aortic atherosclerosis: Continue Plavix monotherapy and statin with aggressive blood pressure control. 3.  Hyperlipidemia: Check lipid panel and LFTs next week; goal LDL is less than 70. 4.  Elevated BMI: Will check hemoglobin A1c next week if above 6.5 will refer to pharmacy for recommendations regarding GLP-1 receptor agonism. 5.  Primary hypertension: Start losartan 25 mg and check labs next week.             Dispo:  No follow-ups on file.      Medication Adjustments/Labs and Tests Ordered: Current medicines are reviewed at length with the patient today.  Concerns regarding medicines are outlined above.  The following changes have been made:     Labs/tests ordered: Orders Placed This Encounter  Procedures   Hemoglobin A1c   Comprehensive metabolic panel   Lipid panel   EKG 12-Lead    Medication Changes: Meds ordered this encounter  Medications   losartan (COZAAR) 25 MG tablet    Sig: Take 1 tablet (25 mg total) by mouth daily.    Dispense:  90 tablet    Refill:  3    Current medicines are reviewed at length with the patient today.  The patient does not have concerns regarding medicines.  History of Present Illness:   FOCUSED PROBLEM  LIST:   1.  Asthma 2.  Aortic atherosclerosis on CT 2021 3.  GERD 4.  Coronary artery disease status post elective PCI of proximal LAD June 2023 5.  Prior 75 p-y smoking history 6.  Hyperlipidemia; low LP(a) 7.  BMI of 10 Jul 2021: Patient seen for initial consultation regarding chest pain.  He was referred for coronary CTA which demonstrated high-grade proximal LAD stenosis.  He was started on 2 antianginal agents.  He underwent cardiac catheterization and PCI which was uncomplicated in early June and was discharged on dual antiplatelet therapy with Plavix.  He was seen in follow-up last month and was doing quite well.   July 2023:The patient is doing very well.  He recently completed a trip to know his [place] in [place].  This consisted of a lot of walking.  He did not develop angina, exertional dyspnea, presyncope, or syncope.  He has had no severe bleeding or bruising while on dual antiplatelet therapy.  He feels very well and is without significant complaints today.  Plan: Continue aspirin until January 2024 and then continue Plavix monotherapy indefinitely.   Today:          Current Medications: Current Meds  Medication Sig   albuterol (PROVENTIL HFA;VENTOLIN HFA) 108 (90 Base) MCG/ACT inhaler Inhale 2 puffs into the lungs every 4 (four) hours as needed for wheezing or shortness of breath.   atorvastatin (LIPITOR) 40 MG tablet TAKE 1 TABLET BY MOUTH EVERY DAY  budesonide-formoterol (SYMBICORT) 160-4.5 MCG/ACT inhaler Inhale 2 puffs into the lungs 2 (two) times daily.   clopidogrel (PLAVIX) 75 MG tablet TAKE 1 TABLET BY MOUTH EVERY DAY   losartan (COZAAR) 25 MG tablet Take 1 tablet (25 mg total) by mouth daily.   meloxicam (MOBIC) 15 MG tablet Take 15 mg by mouth daily.   Multiple Vitamin (MULTIVITAMIN WITH MINERALS) TABS tablet Take 1 tablet by mouth in the morning.   nitroGLYCERIN (NITROSTAT) 0.4 MG SL tablet Place 1 tablet (0.4 mg total) under the tongue every 5 (five) minutes as  needed for chest pain. x 3 doses per chest pain episode.  Call 911 if third dose is taken.   [DISCONTINUED] metoprolol succinate (TOPROL-XL) 25 MG 24 hr tablet TAKE 1/2 TABLET BY MOUTH AT BEDTIME.     Allergies:    Aleve [naproxen]   Social History:   Social History   Tobacco Use   Smoking status: Former   Smokeless tobacco: Never   Tobacco comments:    QUIT IN 2004  Vaping Use   Vaping status: Never Used  Substance Use Topics   Alcohol use: No    Comment: no etoh in 18 years    Drug use: No     Family Hx: Family History  Problem Relation Age of Onset   Heart attack Father 45       died MI 10   Heart attack Brother 30       CABG 76     Review of Systems:   Please see the history of present illness.    All other systems reviewed and are negative.     EKGs/Labs/Other Test Reviewed:   EKG:    EKG Interpretation Date/Time:  Wednesday September 26 2022 08:50:11 EDT Ventricular Rate:  61 PR Interval:  140 QRS Duration:  80 QT Interval:  404 QTC Calculation: 406 R Axis:   3  Text Interpretation: Normal sinus rhythm Normal ECG When compared with ECG of 02-Aug-2021 10:28, No significant change was found Confirmed by Alverda Skeans ([id number]) on 09/26/2022 9:00:20 AM        Prior CV studies reviewed: Cardiac Studies & Procedures   CARDIAC CATHETERIZATION  CARDIAC CATHETERIZATION 08/02/2021  Narrative   Prox RCA lesion is 10% stenosed.   Prox LAD lesion is 80% stenosed.   A stent was successfully placed.   Post intervention, there is a 0% residual stenosis.   LV end diastolic pressure is normal.  1.  High-grade proximal LAD lesion treated with OCT guided PCI with 1 drug-eluting stent with mild disease elsewhere. 2.  LVEDP of 7 to 9 mmHg.  Recommendation: Dual antiplatelet therapy for 6 months and same-day discharge for PCI with cardiology follow-up.  Findings Coronary Findings Diagnostic  Dominance: Right  Left Anterior Descending Prox LAD lesion is 80%  stenosed.  Right Coronary Artery Prox RCA lesion is 10% stenosed.  Intervention  Prox LAD lesion Stent A stent was successfully placed. Post-Intervention Lesion Assessment The intervention was successful. Pre-interventional TIMI flow is 3. Post-intervention TIMI flow is 3. No complications occurred at this lesion. There is a 0% residual stenosis post intervention.     ECHOCARDIOGRAM  ECHOCARDIOGRAM COMPLETE 07/11/2021  Narrative ECHOCARDIOGRAM REPORT    Patient Name:   Raheem Balling Date of Exam: 07/11/2021 Medical Rec #:  [id number]      Height:       71.0 in Accession #:    [id number]     Weight:  217.4 lb Date of Birth:  [birth year]      BSA:          2.185 m Patient Age:    68 years       BP:           124/70 mmHg Patient Gender: M              HR:           61 bpm. Exam Location:  [address]  Procedure: 2D Echo, 3D Echo, Cardiac Doppler and Color Doppler  Indications:    R06.00 Dyspnea  History:        Patient has no prior history of Echocardiogram examinations. Signs/Symptoms:Dyspnea; Risk Factors:Family History of Coronary Artery Disease and Former Smoker. Obesity.  Sonographer:    Farrel Conners RDCS Referring Phys: Orbie Pyo  IMPRESSIONS   1. Left ventricular ejection fraction, by estimation, is 65 to 70%. The left ventricle has normal function. The left ventricle has no regional wall motion abnormalities. Left ventricular diastolic parameters were normal. 2. Right ventricular systolic function is normal. The right ventricular size is normal. 3. Left atrial size was severely dilated. 4. Right atrial size was mildly dilated. 5. The mitral valve is normal in structure. Trivial mitral valve regurgitation. No evidence of mitral stenosis. 6. The aortic valve is tricuspid. Aortic valve regurgitation is not visualized. No aortic stenosis is present. Aortic valve area, by VTI measures 2.52 cm. Aortic valve mean gradient measures 7.0 mmHg. Aortic valve  Vmax measures 1.86 m/s. 7. The inferior vena cava is normal in size with greater than 50% respiratory variability, suggesting right atrial pressure of 3 mmHg.  FINDINGS Left Ventricle: Left ventricular ejection fraction, by estimation, is 65 to 70%. The left ventricle has normal function. The left ventricle has no regional wall motion abnormalities. The left ventricular internal cavity size was normal in size. There is no left ventricular hypertrophy. Left ventricular diastolic parameters were normal. Normal left ventricular filling pressure.  Right Ventricle: The right ventricular size is normal. No increase in right ventricular wall thickness. Right ventricular systolic function is normal.  Left Atrium: Left atrial size was severely dilated.  Right Atrium: Right atrial size was mildly dilated.  Pericardium: There is no evidence of pericardial effusion.  Mitral Valve: The mitral valve is normal in structure. Trivial mitral valve regurgitation. No evidence of mitral valve stenosis.  Tricuspid Valve: The tricuspid valve is normal in structure. Tricuspid valve regurgitation is not demonstrated. No evidence of tricuspid stenosis.  Aortic Valve: The aortic valve is tricuspid. Aortic valve regurgitation is not visualized. No aortic stenosis is present. Aortic valve mean gradient measures 7.0 mmHg. Aortic valve peak gradient measures 13.8 mmHg. Aortic valve area, by VTI measures 2.52 cm.  Pulmonic Valve: The pulmonic valve was normal in structure. Pulmonic valve regurgitation is not visualized. No evidence of pulmonic stenosis.  Aorta: The aortic root is normal in size and structure.  Venous: The inferior vena cava is normal in size with greater than 50% respiratory variability, suggesting right atrial pressure of 3 mmHg.  IAS/Shunts: No atrial level shunt detected by color flow Doppler.   LEFT VENTRICLE PLAX 2D LVIDd:         4.65 cm   Diastology LVIDs:         2.80 cm   LV e' medial:     9.57 cm/s LV PW:         0.95 cm   LV E/e' medial:  10.0 LV  IVS:        0.80 cm   LV e' lateral:   12.70 cm/s LVOT diam:     2.40 cm   LV E/e' lateral: 7.5 LV SV:         110 LV SV Index:   50 LVOT Area:     4.52 cm  3D Volume EF: 3D EF:        62 % LV EDV:       132 ml LV ESV:       51 ml LV SV:        81 ml  RIGHT VENTRICLE RV Basal diam:  4.60 cm RV Mid diam:    3.80 cm RV S prime:     16.50 cm/s TAPSE (M-mode): 3.1 cm  LEFT ATRIUM              Index        RIGHT ATRIUM           Index LA diam:        4.10 cm  1.88 cm/m   RA Area:     21.50 cm LA Vol (A2C):   50.2 ml  22.98 ml/m  RA Volume:   66.10 ml  30.26 ml/m LA Vol (A4C):   101.0 ml 46.23 ml/m LA Biplane Vol: 72.8 ml  33.32 ml/m AORTIC VALVE AV Area (Vmax):    2.75 cm AV Area (Vmean):   2.76 cm AV Area (VTI):     2.52 cm AV Vmax:           186.00 cm/s AV Vmean:          123.000 cm/s AV VTI:            0.436 m AV Peak Grad:      13.8 mmHg AV Mean Grad:      7.0 mmHg LVOT Vmax:         113.00 cm/s LVOT Vmean:        74.967 cm/s LVOT VTI:          0.243 m LVOT/AV VTI ratio: 0.56  AORTA Ao Root diam: 2.80 cm Ao Asc diam:  3.25 cm  MITRAL VALVE MV Area (PHT): cm         SHUNTS MV Decel Time: 224 msec    Systemic VTI:  0.24 m MV E velocity: 95.40 cm/s  Systemic Diam: 2.40 cm MV A velocity: 81.00 cm/s MV E/A ratio:  1.18  Chilton Si MD Electronically signed by Chilton Si MD Signature Date/Time: 07/11/2021/7:27:48 PM    Final     CT SCANS  CT CORONARY MORPH W/CTA COR W/SCORE 07/13/2021  Addendum 07/13/2021 11:40 AM ADDENDUM REPORT: 07/13/2021 11:37  CLINICAL DATA:  Chest pain  EXAM: Cardiac/Coronary CTA  TECHNIQUE: A non-contrast, gated CT scan was obtained with axial slices of 3 mm through the heart for calcium scoring. Calcium scoring was performed using the Agatston method. A 120 kV prospective, gated, contrast cardiac scan was obtained. Gantry rotation speed was 250  msecs and collimation was 0.6 mm. Two sublingual nitroglycerin tablets (0.8 mg) were given. The 3D data set was reconstructed in 5% intervals of the 35-75% of the R-R cycle. Diastolic phases were analyzed on a dedicated workstation using MPR, MIP, and VRT modes. The patient received 95 cc of contrast.  FINDINGS: Image quality: Excellent.  Noise artifact is: Limited.  Coronary Arteries:  Normal coronary origin.  Right dominance.  Left main: The left main is a large caliber vessel  with a normal take off from the left coronary cusp that bifurcates to form a left anterior descending artery and a left circumflex artery. There is minimal calcified plaque (<25%).  Left anterior descending artery: The proximal LAD contains a severe mixed density plaque (70-99%). The mid and distal segments contain minimal mixed density plaque (<25%). The LAD gives off 1 patent diagonal branches.  Left circumflex artery: The LCX is non-dominant. The proximal LCX contains minimal calcified plaque (<25%). The mid LCX contains mild non-calcified plaque (25-49%). The distal segment is patent. The LCX gives off 3 patent obtuse marginal branches.  Right coronary artery: The RCA is dominant with normal take off from the right coronary cusp. There is minimal calcified plaque (<25%) in the proximal and mid segments. The distal RCA is patent. The RCA terminates as a PDA and right posterolateral branch without evidence of plaque or stenosis.  Right Atrium: Right atrial size is within normal limits.  Right Ventricle: The right ventricular cavity is within normal limits.  Left Atrium: Left atrial size is normal in size with no left atrial appendage filling defect.  Left Ventricle: The ventricular cavity size is within normal limits.  Pulmonary arteries: Normal in size without proximal filling defect.  Pulmonary veins: Normal pulmonary venous drainage.  Pericardium: Normal thickness without significant  effusion or calcium present.  Cardiac valves: The aortic valve is trileaflet without significant calcification. The mitral valve is normal without significant calcification.  Aorta: Normal caliber without significant disease.  Extra-cardiac findings: See attached radiology report for non-cardiac structures.  IMPRESSION: 1. Coronary calcium score of 325. This was 71st percentile for age-, sex, and race-matched controls.  2. Normal coronary origin with right dominance.  3. Severe mixed density plaque (70-99%) in the proximal LAD.  4. Mild non-calcified plaque (25-49%) in the mid LCX.  5. Minimal calcified plaque (<25%) in the RCA.  RECOMMENDATIONS: 1. Severe stenosis in the proximal LAD (70-99%). CT FFR will be submitted. Consider symptom-guided anti-ischemic pharmacotherapy as well as risk factor modification per guideline directed care. Invasive coronary angiography recommended with revascularization per published guideline statements.  Lennie Odor, MD   Electronically Signed By: Lennie Odor M.D. On: 07/13/2021 11:37  Narrative EXAM: OVER-READ INTERPRETATION  CT CHEST  The following report is a limited chest CT over-read performed by radiologist Dr. Trudie Reed of [facility] on 07/13/2021. The coronary calcium score and cardiac CTA interpretation by the cardiologist is attached.  COMPARISON:  None Available.  FINDINGS: Atherosclerotic calcifications in the thoracic aorta. Irregular-shaped 8 x 5 mm nodule in the right middle lobe (axial image 24 of series 12), with some adjacent lucency in the lungs suggesting air trapping. Within the visualized portions of the thorax there are no other larger more suspicious appearing pulmonary nodules or masses, there is no acute consolidative airspace disease, no pleural effusions, no pneumothorax and no lymphadenopathy. Visualized portions of the upper abdomen are unremarkable. There are no aggressive  appearing lytic or blastic lesions noted in the visualized portions of the skeleton.  IMPRESSION: 1. 8 x 5 mm right middle lobe nodule with some associated air trapping. This could represent an area of mucoid impaction within some bronchi, however, close attention on follow-up studies is recommended as the possibility of an endobronchial or intraparenchymal nodule is not excluded. Non-contrast chest CT at 6-12 months is recommended. If the nodule is stable at time of repeat CT, then future CT at 18-24 months (from today's scan) is considered optional for low-risk patients, but  is recommended for high-risk patients. This recommendation follows the consensus statement: Guidelines for Management of Incidental Pulmonary Nodules Detected on CT Images: From the Fleischner Society 2017; Radiology 2017; 284:228-243. 2.  Aortic Atherosclerosis (ICD10-I70.0).  Electronically Signed: By: Trudie Reed M.D. On: 07/13/2021 09:57          Other studies Reviewed: Aortic atherosclerosis on CT 2021  Recent Labs: No results found for requested labs within last 365 days.   Lipid Panel    Component Value Date/Time   CHOL 131 08/22/2021 0720   TRIG 89 08/22/2021 0720   HDL 52 08/22/2021 0720   CHOLHDL 2.5 08/22/2021 0720   LDLCALC 62 08/22/2021 0720    Risk Assessment/Calculations:          Physical Exam:   VS:  BP (!) 140/80   Pulse 60   Ht 6' (1.829 m)   Wt 227 lb (103 kg)   SpO2 96%   BMI 30.79 kg/m    HYPERTENSION CONTROL Vitals:   09/26/22 0852 09/26/22 0908  BP: (!) 140/80 (!) 140/80    The patient's blood pressure is elevated above target today.  In order to address the patient's elevated BP: A new medication was prescribed today.      Wt Readings from Last 3 Encounters:  09/26/22 227 lb (103 kg)  08/30/21 211 lb (95.7 kg)  08/09/21 210 lb 6.4 oz (95.4 kg)      GENERAL:  No apparent distress, AOx3 HEENT:  No carotid bruits, +2 carotid impulses, no scleral  icterus CAR: RRR no murmurs, gallops, rubs, or thrills RES:  Clear to auscultation bilaterally ABD:  Soft, nontender, nondistended, positive bowel sounds x 4 VASC:  +2 radial pulses, +2 carotid pulses NEURO:  CN 2-12 grossly intact; motor and sensory grossly intact PSYCH:  No active depression or anxiety EXT:  No edema, ecchymosis, or cyanosis  Signed, Orbie Pyo, MD  09/26/2022 9:22 AM    [facility] [address] [place], [place], [place]  [address] Phone: [phone]; Fax: [phone]   Note:  This document was prepared using Dragon voice recognition software and may include unintentional dictation errors.

## 2022-09-26 ENCOUNTER — Encounter: Payer: Self-pay | Admitting: Internal Medicine

## 2022-09-26 ENCOUNTER — Ambulatory Visit: Payer: Medicare HMO | Attending: Internal Medicine | Admitting: Internal Medicine

## 2022-09-26 VITALS — BP 140/80 | HR 60 | Ht 72.0 in | Wt 227.0 lb

## 2022-09-26 DIAGNOSIS — I700 Atherosclerosis of aorta: Secondary | ICD-10-CM | POA: Diagnosis not present

## 2022-09-26 DIAGNOSIS — E785 Hyperlipidemia, unspecified: Secondary | ICD-10-CM | POA: Diagnosis not present

## 2022-09-26 DIAGNOSIS — I2510 Atherosclerotic heart disease of native coronary artery without angina pectoris: Secondary | ICD-10-CM | POA: Diagnosis not present

## 2022-09-26 DIAGNOSIS — Z6829 Body mass index (BMI) 29.0-29.9, adult: Secondary | ICD-10-CM

## 2022-09-26 DIAGNOSIS — I10 Essential (primary) hypertension: Secondary | ICD-10-CM | POA: Diagnosis not present

## 2022-09-26 MED ORDER — LOSARTAN POTASSIUM 25 MG PO TABS: 25.0000 mg | ORAL_TABLET | Freq: Every day | ORAL | 3 refills | Status: DC

## 2022-09-26 NOTE — Patient Instructions (Addendum)
Medication Instructions:  Your physician has recommended you make the following change in your medication:  1-STOP aspirin 2-STOP Metoprolol 3-START Losartan 25 mg by mouth daily  *If you need a refill on your cardiac medications before your next appointment, please call your pharmacy*  Lab Work: Your physician recommends that you return for lab work in: 1 week for CMET, Lipid panel, and HgbA1c  If you have labs (blood work) drawn today and your tests are completely normal, you will receive your results only by: MyChart Message (if you have MyChart) OR A paper copy in the mail If you have any lab test that is abnormal or we need to change your treatment, we will call you to review the results.  Follow-Up: At [facility], you and your health needs are our priority.  As part of our continuing mission to provide you with exceptional heart care, we have created designated Provider Care Teams.  These Care Teams include your primary Cardiologist (physician) and Advanced Practice Providers (APPs -  Physician Assistants and Nurse Practitioners) who all work together to provide you with the care you need, when you need it.  We recommend signing up for the patient portal called "MyChart".  Sign up information is provided on this After Visit Summary.  MyChart is used to connect with patients for Virtual Visits (Telemedicine).  Patients are able to view lab/test results, encounter notes, upcoming appointments, etc.  Non-urgent messages can be sent to your provider as well.   To learn more about what you can do with MyChart, go to [email or web address].    Your next appointment:   6 month(s)  Provider:   Jari Favre, PA-C, Ronie Spies, PA-C, Robin Searing, NP, Jacolyn Reedy, PA-C, Eligha Bridegroom, NP, Tereso Newcomer, PA-C, or Perlie Gold, PA-C     Then, Orbie Pyo, MD will plan to see you again in 1 year(s).

## 2022-10-04 ENCOUNTER — Ambulatory Visit: Payer: Medicare HMO | Attending: Internal Medicine

## 2022-10-04 DIAGNOSIS — E785 Hyperlipidemia, unspecified: Secondary | ICD-10-CM | POA: Diagnosis not present

## 2022-10-04 DIAGNOSIS — I10 Essential (primary) hypertension: Secondary | ICD-10-CM

## 2022-10-04 DIAGNOSIS — Z6829 Body mass index (BMI) 29.0-29.9, adult: Secondary | ICD-10-CM

## 2022-10-04 DIAGNOSIS — I2510 Atherosclerotic heart disease of native coronary artery without angina pectoris: Secondary | ICD-10-CM | POA: Diagnosis not present

## 2022-10-04 DIAGNOSIS — I700 Atherosclerosis of aorta: Secondary | ICD-10-CM

## 2022-10-05 LAB — LIPID PANEL
Chol/HDL Ratio: 2.8 ratio (ref 0.0–5.0)
Cholesterol, Total: 152 mg/dL (ref 100–199)
HDL: 54 mg/dL (ref >39)
LDL Chol Calc (NIH): 75 mg/dL (ref 0–99)
Triglycerides: 128 mg/dL (ref 0–149)
VLDL Cholesterol Cal: 23 mg/dL (ref 5–40)

## 2022-10-05 LAB — COMPREHENSIVE METABOLIC PANEL
ALT: 41 IU/L (ref 0–44)
AST: 24 IU/L (ref 0–40)
Albumin: 4.8 g/dL (ref 3.9–4.9)
Alkaline Phosphatase: 93 IU/L (ref 44–121)
BUN/Creatinine Ratio: 15 (ref 10–24)
BUN: 13 mg/dL (ref 8–27)
Bilirubin Total: 1.7 mg/dL — ABNORMAL HIGH (ref 0.0–1.2)
CO2: 24 mmol/L (ref 20–29)
Calcium: 9.6 mg/dL (ref 8.6–10.2)
Chloride: 102 mmol/L (ref 96–106)
Creatinine, Ser: 0.89 mg/dL (ref 0.76–1.27)
Globulin, Total: 2.2 g/dL (ref 1.5–4.5)
Glucose: 157 mg/dL — ABNORMAL HIGH (ref 70–99)
Potassium: 4.9 mmol/L (ref 3.5–5.2)
Sodium: 140 mmol/L (ref 134–144)
Total Protein: 7 g/dL (ref 6.0–8.5)
eGFR: 93 mL/min/{1.73_m2} (ref >59)

## 2022-10-05 LAB — HEMOGLOBIN A1C
Est. average glucose Bld gHb Est-mCnc: 166 mg/dL
Hgb A1c MFr Bld: 7.4 % — ABNORMAL HIGH (ref 4.8–5.6)

## 2022-10-08 ENCOUNTER — Telehealth: Payer: Self-pay

## 2022-10-08 DIAGNOSIS — E785 Hyperlipidemia, unspecified: Secondary | ICD-10-CM

## 2022-10-08 NOTE — Telephone Encounter (Signed)
-----   Message from Orbie Pyo sent at 10/05/2022  7:18 AM EDT ----- Let PCP know hba1c 7.4 c/w diagnosis of DM; refer to pharm D for elevated BMI and DM.  Will start SGLT2i later.

## 2022-10-08 NOTE — Telephone Encounter (Signed)
Referral has been placed. 

## 2022-10-22 ENCOUNTER — Ambulatory Visit: Payer: Medicare HMO | Attending: Cardiovascular Disease | Admitting: Pharmacist

## 2022-10-22 VITALS — BP 140/80 | HR 59 | Wt 224.0 lb

## 2022-10-22 DIAGNOSIS — E119 Type 2 diabetes mellitus without complications: Secondary | ICD-10-CM | POA: Diagnosis not present

## 2022-10-22 NOTE — Progress Notes (Signed)
Patient ID: Travis Simmons                 DOB: 1956-01-13                    MRN: [id number]     HPI: Travis Simmons is a 67 y.o. male patient referred to PharmD by Dr Lynnette Caffey to discuss GLP1-RA. PMH is significant for CAD s/p PCI of prox LAD 07/2021, aortic atherosclerosis on 2021 CT, HLD, HTN, newly diagnosed DM, asthma, and prior tobacco use. Last saw Dr Lynnette Caffey on 09/26/22, BP was elevated at 140/80. He was started on losartan 25mg  daily with stable follow up BMET. A1c was checked and elevated at 7.4% which is a new diagnosis of diabetes for pt.  Discussed new diabetes diagnosis today with patient. Discussed diet in extensive detail. Pt reports he used to drink 8-12 beers a day + liquor and smoked 3 PPD. He quit both on the same day and found that after, he substituted these habits with sugar. He would eat 3-4 full size candy bars a day which he has since almost quit since his DM diagnosis. Now having 1 candy bar a week and plans to stop after the last few are gone. Wakes up around 3am and has a Midwife donut stick every morning which he doesn't wish to give up. Breakfast is usually a sausage biscuit, otherwise once a week he has eggs and smoked sausage or country ham. Used to drink a big glass of orange juice which he has stopped. Lunch would be a sandwich with deli meat, fruit in a cup, and oatmeal cookie (has since stopped having the cookie). Dinner would be hamburger steak or sausage, likes fast food. Drinks water and regular Gatorade (1 a day). Cut out bread. Has 4 cups of black coffee in the AM at home, then a few more if he goes out to breakfast.  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 Reports gaining 15 lbs recently due to decreased physical activity after carpal tunnel surgery.  Family History: Father died from MI at 36, brother with CABG at 75.  Social History: Former smoker, 75 pack year history, quit in 2004. No alcohol or drug  use.  Labs: Lab Results  Component Value Date   HGBA1C 7.4 (H) 10/04/2022    Wt Readings from Last 1 Encounters:  09/26/22 227 lb (103 kg)    BP Readings from Last 1 Encounters:  09/26/22 (!) 140/80   Pulse Readings from Last 1 Encounters:  09/26/22 60       Component Value Date/Time   CHOL 152 10/04/2022 0717   TRIG 128 10/04/2022 0717   HDL 54 10/04/2022 0717   CHOLHDL 2.8 10/04/2022 0717   LDLCALC 75 10/04/2022 0717    Past Medical History:  Diagnosis Date   Arthritis    Asthma    Atherosclerosis of aorta (HCC)    GERD (gastroesophageal reflux disease)    hx of    Gout    Obesity    Osteoarthritis    Paresthesia    Pre-diabetes     Current Outpatient Medications on File Prior to Visit  Medication Sig Dispense Refill   albuterol (PROVENTIL HFA;VENTOLIN HFA) 108 (90 Base) MCG/ACT inhaler Inhale 2 puffs into the lungs every 4 (four) hours as needed for wheezing or shortness of breath.     atorvastatin (LIPITOR) 40 MG tablet TAKE 1 TABLET BY MOUTH EVERY  DAY 90 tablet 3   budesonide-formoterol (SYMBICORT) 160-4.5 MCG/ACT inhaler Inhale 2 puffs into the lungs 2 (two) times daily.     clopidogrel (PLAVIX) 75 MG tablet TAKE 1 TABLET BY MOUTH EVERY DAY 90 tablet 3   losartan (COZAAR) 25 MG tablet Take 1 tablet (25 mg total) by mouth daily. 90 tablet 3   meloxicam (MOBIC) 15 MG tablet Take 15 mg by mouth daily.     Multiple Vitamin (MULTIVITAMIN WITH MINERALS) TABS tablet Take 1 tablet by mouth in the morning.     nitroGLYCERIN (NITROSTAT) 0.4 MG SL tablet Place 1 tablet (0.4 mg total) under the tongue every 5 (five) minutes as needed for chest pain. x 3 doses per chest pain episode.  Call 911 if third dose is taken. 25 tablet 3   No current facility-administered medications on file prior to visit.    Allergies  Allergen Reactions   Aleve [Naproxen] Rash     Assessment/Plan:  1. T2DM - Pt newly diabetic with A1c of 7.4% a few weeks ago. Discussed lifestyle  modifications extensively today. Pt will work to decrease processed meats, fruit juice, fruit cups with syrup, candy bars, other desserts, and will change to sugar-free drinks. Encouraged 150 minutes of activity. Also reviewed medication options including SGLT2i and GLP1RA - both would provide glycemic lowering and cardiovascular benefit, GLP would provide added weight loss. He does not have CHF or CKD so don't have a strong preference towards SGLT2i. He prefers GLP1RA but wishes to focus on lifestyle improvements first. There is much room for lifestyle improvement and he has already made a great start, this should help improve his A1c notably. Will recheck A1c in November and can discuss Mounjaro again at that time. Advised him to let me know if he wishes to try med sooner before upcoming labs. Reviewed anticipated copay of ~$45-50/month for either med option.  2. HTN - BP elevated above goal <130/39mmHg. Currently on losartan 25mg  daily, will not titrate dose due to K of 4.9. Lifestyle changes should also improve his BP. His diet is high in sodium (fast food, sausage biscuits, other processed meats), also drinking 6-8 cups of caffeinated coffee a day. Discussed heart-healthy diet today including limiting daily sodium to <2g. He will also work to decrease coffee to 2 cups a day. Limiting salt/caffeine, increasing physical activity, and weight loss should all help to improve his BP.  Colene [place] E. Zerenity Bowron, PharmD, BCACP, CPP [facility] [address] [place], [place] [address] Phone: [phone]; Fax: [phone] 10/22/2022 9:18 AM

## 2022-10-22 NOTE — Patient Instructions (Addendum)
Your blood pressure goal is < 130/59mmHg -Continue taking your losartan -Aim for < 2,000mg  of salt each day -Cut back on fast food, sausage biscuits, deli meat, country ham, etc -Aim for 150 minutes of activity each week  Your hemoglobin A1c is 7.4%, anything 6.5% and up is in the diabetic range -Try to cut back on candy bars, desserts, fruit cups with juice in them, fruit juice -Look for sugar free Gatorade, soda, or juice -Go for whole grains instead of white carbs -Medications we discussed (either would be ~$45-50/month): Once weekly injection like Mounjaro or Ozempic - this lowers your blood sugar, helps prevent heart attacks and strokes, and can help with weight loss  Once daily pill like Farxiga or Jardiance - this lowers your blood sugar and helps prevent heart attacks and strokes  Recheck A1c on Monday, November 25th any time after 7:30am Call Aundra Millet, PharmD if you would like to start either of these medication classes before then at [phone]  Tips for living a healthier life     Building a Healthy and Balanced Diet Make most of your meal vegetables and fruits -  of your plate. Aim for color and variety, and remember that potatoes don't count as vegetables on the Healthy Eating Plate because of their negative impact on blood sugar.  Go for whole grains -  of your plate. Whole and intact grains--whole wheat, barley, wheat berries, quinoa, oats, brown rice, and foods made with them, such as whole wheat pasta--have a milder effect on blood sugar and insulin than white bread, white rice, and other refined grains.  Protein power -  of your plate. Fish, poultry, beans, and nuts are all healthy, versatile protein sources--they can be mixed into salads, and pair well with vegetables on a plate. Limit red meat, and avoid processed meats such as bacon and sausage.  Healthy plant oils - in moderation. Choose healthy vegetable oils like olive, canola, soy, corn, sunflower, peanut,  and others, and avoid partially hydrogenated oils, which contain unhealthy trans fats. Remember that low-fat does not mean "healthy."  Drink water, coffee, or tea. Skip sugary drinks, limit milk and dairy products to one to two servings per day, and limit juice to a small glass per day.  Stay active. The red figure running across the Healthy Eating Plate's placemat is a reminder that staying active is also important in weight control.  The main message of the Healthy Eating Plate is to focus on diet quality:  The type of carbohydrate in the diet is more important than the amount of carbohydrate in the diet, because some sources of carbohydrate--like vegetables (other than potatoes), fruits, whole grains, and beans--are healthier than others. The Healthy Eating Plate also advises consumers to avoid sugary beverages, a major source of calories--usually with little nutritional value--in the American diet. The Healthy Eating Plate encourages consumers to use healthy oils, and it does not set a maximum on the percentage of calories people should get each day from healthy sources of fat. In this way, the Healthy Eating Plate recommends the opposite of the low-fat message promoted for decades by the USDA.  [employer]  SUGAR  Sugar is a huge problem in the modern day diet. Sugar is a big contributor to heart disease, diabetes, high triglyceride levels, fatty liver disease and obesity. Sugar is hidden in almost all packaged foods/beverages. Added sugar is extra sugar that is added beyond what is naturally found and has no nutritional benefit for your body. The American Heart  Association recommends limiting added sugars to no more than 25g for women and 36 grams for men per day. There are many names for sugar including maltose, sucrose (names ending in "ose"), high fructose corn syrup, molasses, cane sugar, corn sweetener, raw sugar, syrup, honey or  fruit juice concentrate.   One of the best ways to limit your added sugars is to stop drinking sweetened beverages such as soda, sweet tea, and fruit juice.  There is 65g of added sugars in one 20oz bottle of Coke! That is equal to 7.5 donuts.   Pay attention and read all nutrition facts labels. Below is an examples of a nutrition facts label. The #1 is showing you the total sugars where the # 2 is showing you the added sugars. This one serving has almost the max amount of added sugars per day!   EXERCISE  Exercise is good. We've all heard that. In an ideal world, we would all have time and resources to get plenty of it. When you are active, your heart pumps more efficiently and you will feel better.  Multiple studies show that even walking regularly has benefits that include living a longer life. The American Heart Association recommends 150 minutes per week of exercise (30 minutes per day most days of the week). You can do this in any increment you wish. Nine or more 10-minute walks count. So does an hour-long exercise class. Break the time apart into what will work in your life. Some of the best things you can do include walking briskly, jogging, cycling or swimming laps. Not everyone is ready to "exercise." Sometimes we need to start with just getting active. Here are some easy ways to be more active throughout the day:  Take the stairs instead of the elevator  Go for a 10-15 minute walk during your lunch break (find a friend to make it more enjoyable)  When shopping, park at the back of the parking lot  If you take public transportation, get off one stop early and walk the extra distance  Pace around while making phone calls  Check with your doctor if you aren't sure what your limitations may be. Always remember to drink plenty of water when doing any type of exercise. Don't feel like a failure if you're not getting the 90-150 minutes per week. If you started by being a couch potato, then just  a 10-minute walk each day is a huge improvement. Start with little victories and work your way up.   HEALTHY EATING TIPS              Plan ahead: make a menu of the meals for a week then create a grocery list to go with that menu. Consider meals that easily stretch into a night of leftovers, such as stews or casseroles. Or consider making two of your favorite meal and put one in the freezer for another night. Try a night or two each week that is "meatless" or "no cook" such as salads. When you get home from the grocery store wash and prepare your vegetables and fruits. Then when you need them they are ready to go.   Tips for going to the grocery store:  Buy store or generic brands  Check the weekly ad from your store on-line or in their in-store flyer  Look at the unit price on the shelf tag to compare/contrast the costs of different items  Buy fruits/vegetables in season  Carrots, bananas and apples are low-cost, naturally healthy  items  If meats or frozen vegetables are on sale, buy some extras and put in your freezer  Limit buying prepared or "ready to eat" items, even if they are pre-made salads or fruit snacks  Do not shop when you're hungry  Foods at eye level tend to be more expensive. Look on the high and low shelves for deals.  Consider shopping at the farmer's market for fresh foods in season.  Avoid the cookie and chip aisles (these are expensive, high in calories and low in nutritional value). Shop on the outside of the grocery store.  Healthy food preparations:  If you can't get lean hamburger, be sure to drain the fat when cooking  Steam, saut (in olive oil), grill or bake foods  Experiment with different seasonings to avoid adding salt to your foods. Kosher salt, sea salt and Himalayan salt are all still salt and should be avoided. Try seasoning food with onion, garlic, thyme, rosemary, basil ect. Onion powder or garlic powder is ok. Avoid if it says salt (ie garlic salt).

## 2022-10-29 DIAGNOSIS — E785 Hyperlipidemia, unspecified: Secondary | ICD-10-CM | POA: Diagnosis not present

## 2022-10-29 DIAGNOSIS — Z23 Encounter for immunization: Secondary | ICD-10-CM | POA: Diagnosis not present

## 2022-10-29 DIAGNOSIS — Z79899 Other long term (current) drug therapy: Secondary | ICD-10-CM | POA: Diagnosis not present

## 2022-10-29 DIAGNOSIS — I2510 Atherosclerotic heart disease of native coronary artery without angina pectoris: Secondary | ICD-10-CM | POA: Diagnosis not present

## 2022-10-29 DIAGNOSIS — Z0000 Encounter for general adult medical examination without abnormal findings: Secondary | ICD-10-CM | POA: Diagnosis not present

## 2022-10-29 DIAGNOSIS — Z125 Encounter for screening for malignant neoplasm of prostate: Secondary | ICD-10-CM | POA: Diagnosis not present

## 2022-10-29 DIAGNOSIS — E119 Type 2 diabetes mellitus without complications: Secondary | ICD-10-CM | POA: Diagnosis not present

## 2022-10-29 DIAGNOSIS — I700 Atherosclerosis of aorta: Secondary | ICD-10-CM | POA: Diagnosis not present

## 2022-10-29 DIAGNOSIS — J45909 Unspecified asthma, uncomplicated: Secondary | ICD-10-CM | POA: Diagnosis not present

## 2022-11-16 DIAGNOSIS — R69 Illness, unspecified: Secondary | ICD-10-CM | POA: Diagnosis not present

## 2023-01-07 ENCOUNTER — Ambulatory Visit: Payer: Medicare HMO | Attending: Internal Medicine

## 2023-01-07 DIAGNOSIS — E119 Type 2 diabetes mellitus without complications: Secondary | ICD-10-CM

## 2023-01-08 ENCOUNTER — Other Ambulatory Visit: Payer: Self-pay | Admitting: Pharmacist

## 2023-01-08 ENCOUNTER — Telehealth: Payer: Self-pay | Admitting: Pharmacist

## 2023-01-08 DIAGNOSIS — E119 Type 2 diabetes mellitus without complications: Secondary | ICD-10-CM

## 2023-01-08 LAB — HEMOGLOBIN A1C
Est. average glucose Bld gHb Est-mCnc: 146 mg/dL
Hgb A1c MFr Bld: 6.7 % — ABNORMAL HIGH (ref 4.8–5.6)

## 2023-01-08 NOTE — Telephone Encounter (Signed)
Patient called back. Congratulated him on the improvement in his A1C. He has been eating no sweets, stopped drinking OJ. 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 Would like to continue to work on lifestyle and recheck in 3-4 months.

## 2023-01-08 NOTE — Telephone Encounter (Signed)
Called pt and LVM to call back.  Calling to review lab results. Good improvement in A1C from 7.4 to 6.7 with lifestyle only. Calling to see how lifestyle changes are going. Pt did discuss Mounjaro with Aundra Millet previously and wanted to work on lifestyle.

## 2023-01-22 ENCOUNTER — Other Ambulatory Visit: Payer: Self-pay | Admitting: Internal Medicine

## 2023-03-05 DIAGNOSIS — L0291 Cutaneous abscess, unspecified: Secondary | ICD-10-CM | POA: Diagnosis not present

## 2023-03-05 DIAGNOSIS — E119 Type 2 diabetes mellitus without complications: Secondary | ICD-10-CM | POA: Diagnosis not present

## 2023-03-07 IMAGING — CT CT HEART MORP W/ CTA COR W/ SCORE W/ CA W/CM &/OR W/O CM
1 series · 12 of 20 positions shown, 15 images · non-contrast
Comparison: None Available.
COMPARISON: None Available.

Addendum:
EXAM:
OVER-READ INTERPRETATION  CT CHEST

The following report is a limited chest CT over-read performed by
07/13/2021. The coronary calcium score and cardiac CTA interpretation
by the cardiologist is attached.
CLINICAL DATA: Chest pain
Cardiac/Coronary CTA
TECHNIQUE: A non-contrast, gated CT scan was obtained with axial slices of 3 mm
through the heart for calcium scoring. Calcium scoring was performed
using the Agatston method. A 120 kV prospective, gated, contrast
cardiac scan was obtained. Gantry rotation speed was 250 msecs and
collimation was 0.6 mm. Two sublingual nitroglycerin tablets (0.8
mg) were given. The 3D data set was reconstructed in 5% intervals of
the 35-75% of the R-R cycle. Diastolic phases were analyzed on a
dedicated workstation using MPR, MIP, and VRT modes. The patient
received 95 cc of contrast.

[Series 696: findings · 12 of 24 slices shown, 15 images]
[im 2/24  vessel]
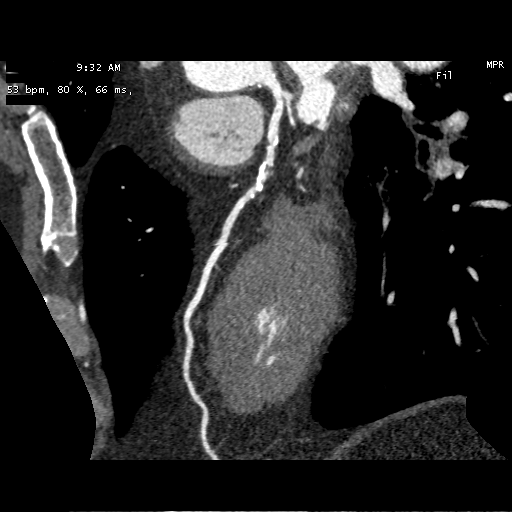
[im 2/24  lung]
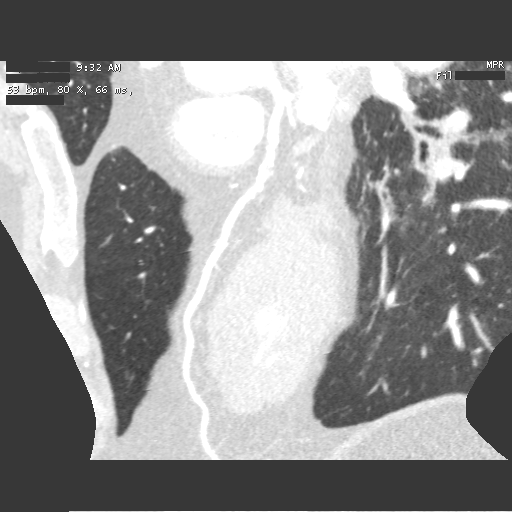
[im 4/24  vessel]
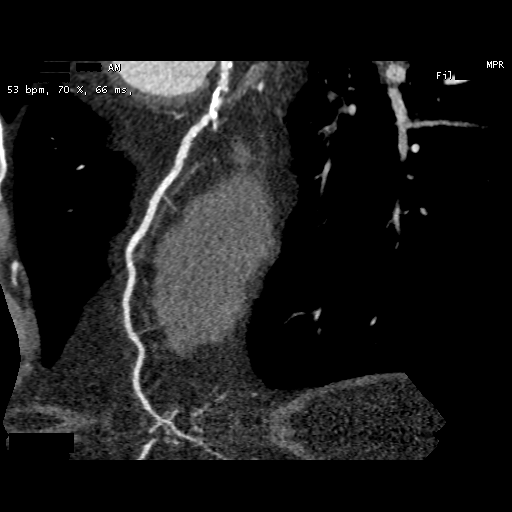
[im 5/24  vessel]
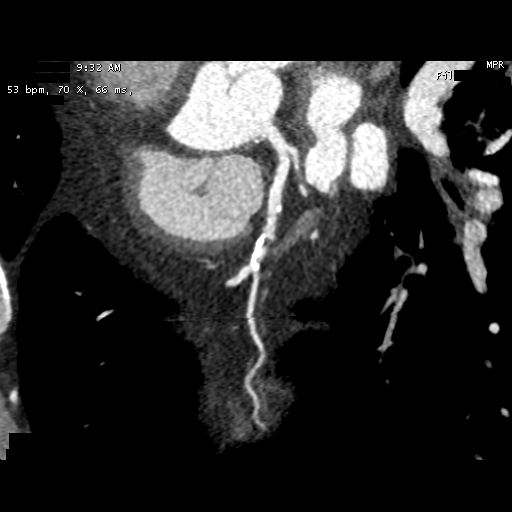
[im 8/24  vessel]
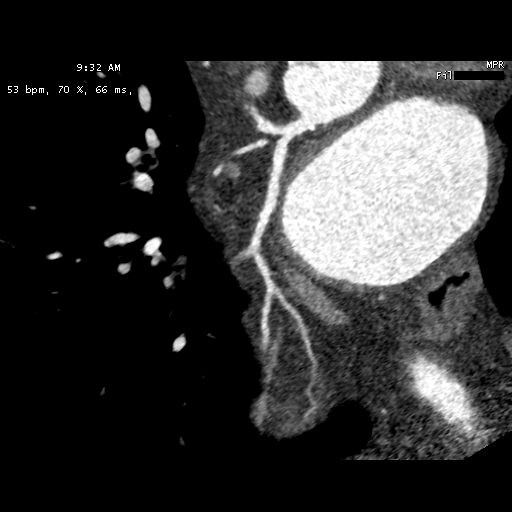
[im 9/24  vessel]
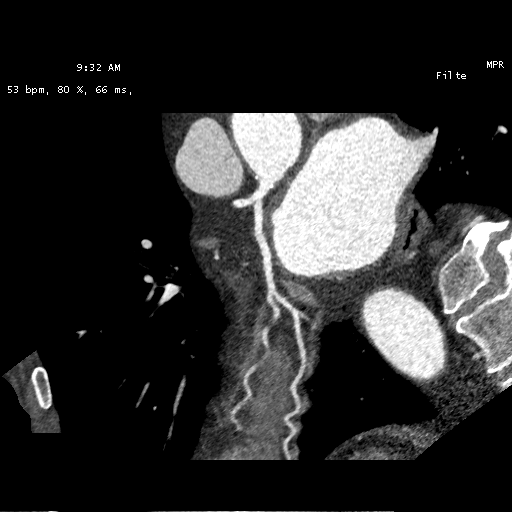
[im 9/24  lung]
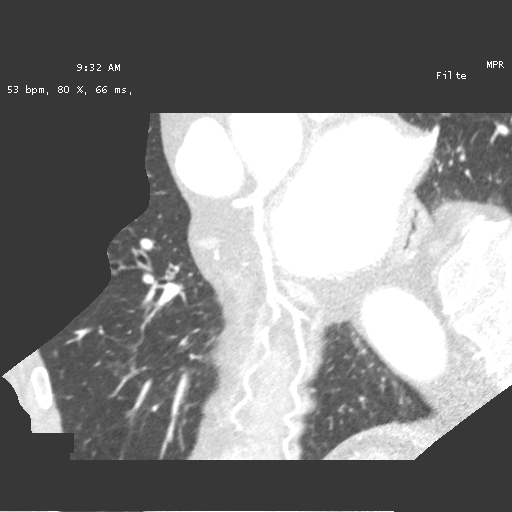
[im 11/24  vessel]
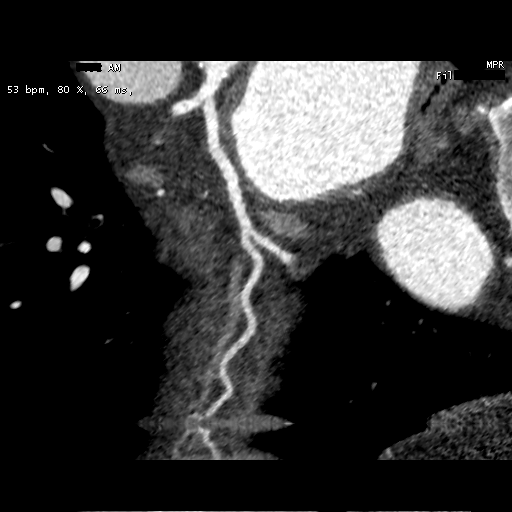
[im 13/24  vessel]
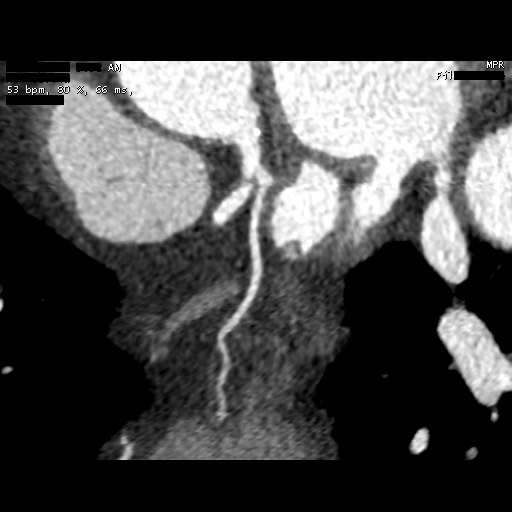
[im 15/24  vessel]
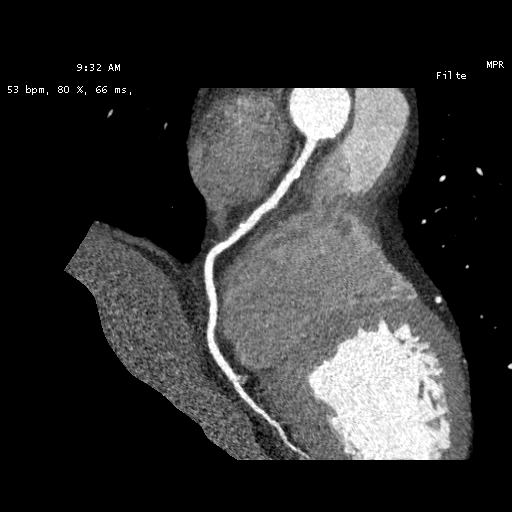
[im 16/24  vessel]
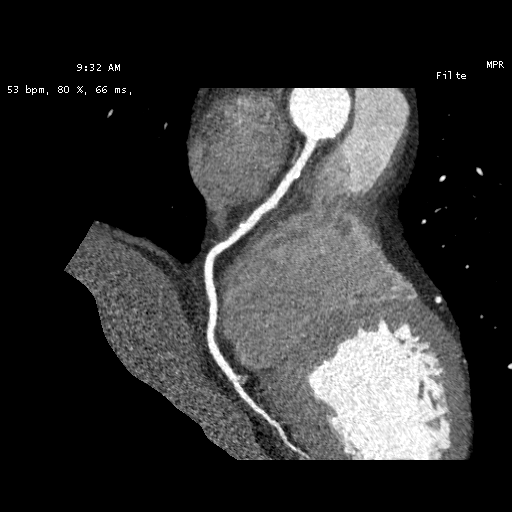
[im 16/24  lung]
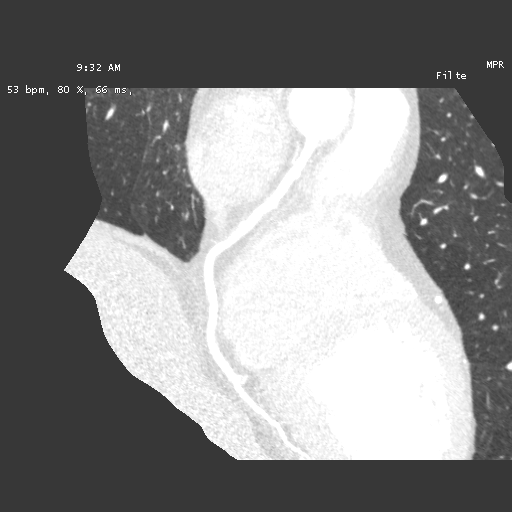
[im 19/24  vessel]
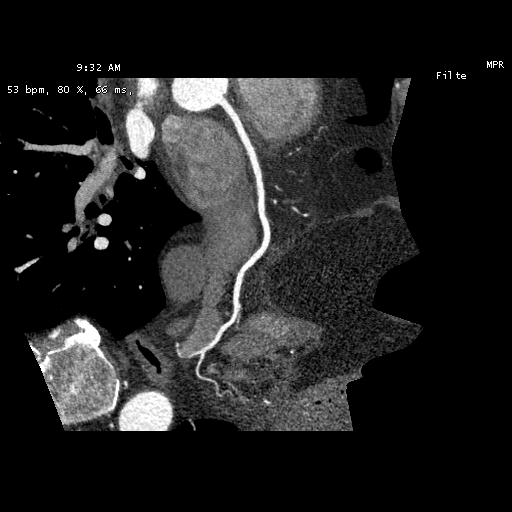
[im 20/24  vessel]
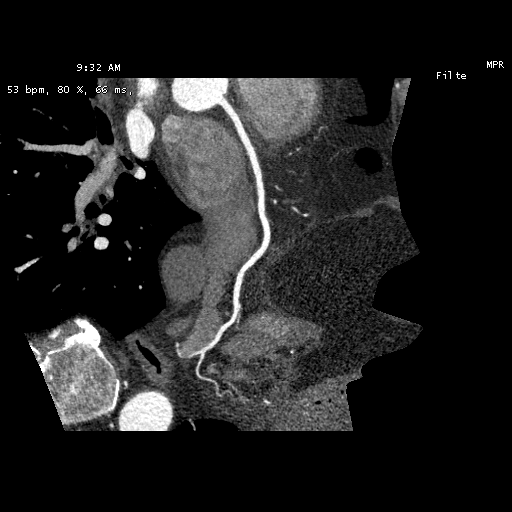
[im 22/24  vessel]
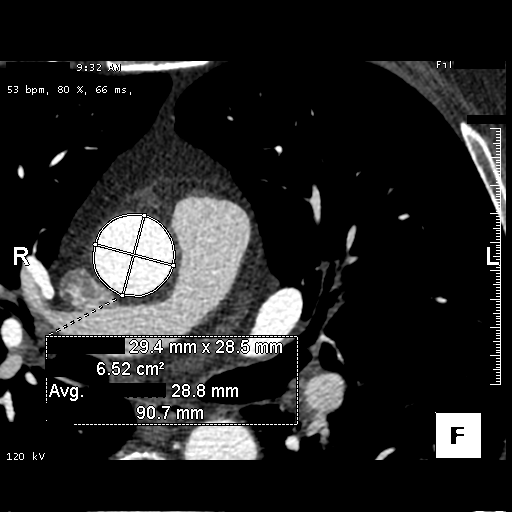

[12 of 20 positions shown; findings below may reference images not displayed]

FINDINGS: Atherosclerotic calcifications in the thoracic aorta.
Irregular-shaped 8 x 5 mm nodule in the right middle lobe (axial
image 24 of series 12), with some adjacent lucency in the lungs
suggesting air trapping. Within the visualized portions of the
thorax there are no other larger more suspicious appearing pulmonary
nodules or masses, there is no acute consolidative airspace disease,
no pleural effusions, no pneumothorax and no lymphadenopathy.
Visualized portions of the upper abdomen are unremarkable. There are
no aggressive appearing lytic or blastic lesions noted in the
visualized portions of the skeleton.
IMPRESSION: 1. 8 x 5 mm right middle lobe nodule with some associated air
trapping. This could represent an area of mucoid impaction within
some bronchi, however, close attention on follow-up studies is
recommended as the possibility of an endobronchial or
intraparenchymal nodule is not excluded. Non-contrast chest CT at
6-12 months is recommended. If the nodule is stable at time of
repeat CT, then future CT at 18-24 months (from today's scan) is
considered optional for low-risk patients, but is recommended for
high-risk patients. This recommendation follows the consensus
statement: Guidelines for Management of Incidental Pulmonary Nodules
Detected on CT Images: From the [HOSPITAL] 0620; Radiology
2.  Aortic Atherosclerosis (XXQ4N-WZO.O).
FINDINGS: Image quality: Excellent.

Noise artifact is: Limited.

Coronary Arteries:  Normal coronary origin.  Right dominance.

Left main: The left main is a large caliber vessel with a normal
take off from the left coronary cusp that bifurcates to form a left
anterior descending artery and a left circumflex artery. There is
minimal calcified plaque (<25%).

Left anterior descending artery: The proximal LAD contains a severe
mixed density plaque (70-99%). The mid and distal segments contain
minimal mixed density plaque (<25%). The LAD gives off 1 patent
diagonal branches.

Left circumflex artery: The LCX is non-dominant. The proximal LCX
contains minimal calcified plaque (<25%). The mid LCX contains mild
non-calcified plaque (25-49%). The distal segment is patent. The LCX
gives off 3 patent obtuse marginal branches.

Right coronary artery: The RCA is dominant with normal take off from
the right coronary cusp. There is minimal calcified plaque (<25%) in
the proximal and mid segments. The distal RCA is patent. The RCA
terminates as a PDA and right posterolateral branch without evidence
of plaque or stenosis.

Right Atrium: Right atrial size is within normal limits.

Right Ventricle: The right ventricular cavity is within normal
limits.

Left Atrium: Left atrial size is normal in size with no left atrial
appendage filling defect.

Left Ventricle: The ventricular cavity size is within normal limits.

Pulmonary arteries: Normal in size without proximal filling defect.

Pulmonary veins: Normal pulmonary venous drainage.

Pericardium: Normal thickness without significant effusion or
calcium present.

Cardiac valves: The aortic valve is trileaflet without significant
calcification. The mitral valve is normal without significant
calcification.

Aorta: Normal caliber without significant disease.

Extra-cardiac findings: See attached radiology report for
non-cardiac structures.
IMPRESSION: 1. Coronary calcium score of 325. This was 71st percentile for age-,
sex, and race-matched controls.

2. Normal coronary origin with right dominance.

3. Severe mixed density plaque (70-99%) in the proximal LAD.

4. Mild non-calcified plaque (25-49%) in the mid LCX.

5. Minimal calcified plaque (<25%) in the RCA.

RECOMMENDATIONS:
1. Severe stenosis in the proximal LAD (70-99%). CT FFR will be
submitted. Consider symptom-guided anti-ischemic pharmacotherapy as
well as risk factor modification per guideline directed care.
Invasive coronary angiography recommended with revascularization per
published guideline statements.

*** End of Addendum ***
EXAM:
OVER-READ INTERPRETATION  CT CHEST

The following report is a limited chest CT over-read performed by
07/13/2021. The coronary calcium score and cardiac CTA interpretation
by the cardiologist is attached.
FINDINGS: Atherosclerotic calcifications in the thoracic aorta.
Irregular-shaped 8 x 5 mm nodule in the right middle lobe (axial
image 24 of series 12), with some adjacent lucency in the lungs
suggesting air trapping. Within the visualized portions of the
thorax there are no other larger more suspicious appearing pulmonary
nodules or masses, there is no acute consolidative airspace disease,
no pleural effusions, no pneumothorax and no lymphadenopathy.
Visualized portions of the upper abdomen are unremarkable. There are
no aggressive appearing lytic or blastic lesions noted in the
visualized portions of the skeleton.
IMPRESSION: 1. 8 x 5 mm right middle lobe nodule with some associated air
trapping. This could represent an area of mucoid impaction within
some bronchi, however, close attention on follow-up studies is
recommended as the possibility of an endobronchial or
intraparenchymal nodule is not excluded. Non-contrast chest CT at
6-12 months is recommended. If the nodule is stable at time of
repeat CT, then future CT at 18-24 months (from today's scan) is
considered optional for low-risk patients, but is recommended for
high-risk patients. This recommendation follows the consensus
statement: Guidelines for Management of Incidental Pulmonary Nodules
Detected on CT Images: From the [HOSPITAL] 0620; Radiology
2.  Aortic Atherosclerosis (XXQ4N-WZO.O).

## 2023-03-08 DIAGNOSIS — L089 Local infection of the skin and subcutaneous tissue, unspecified: Secondary | ICD-10-CM | POA: Diagnosis not present

## 2023-03-08 DIAGNOSIS — L0291 Cutaneous abscess, unspecified: Secondary | ICD-10-CM | POA: Diagnosis not present

## 2023-03-08 DIAGNOSIS — L723 Sebaceous cyst: Secondary | ICD-10-CM | POA: Diagnosis not present

## 2023-03-21 ENCOUNTER — Other Ambulatory Visit: Payer: Self-pay | Admitting: Internal Medicine

## 2023-03-24 NOTE — Progress Notes (Signed)
 Cardiology Office Note:  .   Date:  03/25/2023  ID:  Kirke Captain, DOB [birth year], MRN [id number] PCP: Wyn Heater, MD  [facility] Providers Cardiologist:  Kyra Phy, MD {   History of Present Illness: .   Travis Simmons is a 68 y.o. male with a past medical history of CAD, aortic atherosclerosis, hyperlipidemia, elevated BMI, and hypertension here for follow-up appointment.  Patient was seen back in August and overall was doing well.  He was on Plavix  monotherapy for his CAD.  Labs were ordered to check his cholesterol.  He was started on losartan  for elevated blood pressure and asked to track at home.  Today, he presents with a history of coronary disease, high cholesterol, high blood pressure, and diabetes, for a six-month follow-up. The patient's blood pressure was slightly elevated during the visit, but the patient attributes this to having a high-salt meal and too much coffee prior to the appointment. The patient reports that his blood pressure has been better controlled on losartan , with a recent reading of 134/71.  The patient has made significant lifestyle changes, including quitting smoking and drinking, and reducing sugar intake. The patient reports that he used to consume five or six sweet items a day and a gallon of orange juice every week. After being told his A1C was 7.5, the patient cut out all sweets and saw his A1C drop to 6.5. The patient is hopeful that his A1C has decreased further since his last check.  The patient also mentions having a cyst removed from his back recently. The patient has a history of asthma and has had both shoulders replaced. The patient uses Symbicort daily for his asthma and keeps an albuterol  inhaler on hand for emergencies.  Reports no shortness of breath nor dyspnea on exertion. Reports no chest pain, pressure, or tightness. No edema, orthopnea, PND. Reports no palpitations.   Discussed the use of AI scribe software for clinical  note transcription with the patient, who gave verbal consent to proceed.  ROS: Pertinent ROS in HPI  Studies Reviewed: Aaron Aas        CARDIAC CATHETERIZATION   CARDIAC CATHETERIZATION 08/02/2021   Narrative   Prox RCA lesion is 10% stenosed.   Prox LAD lesion is 80% stenosed.   A stent was successfully placed.   Post intervention, there is a 0% residual stenosis.   LV end diastolic pressure is normal.   1.  High-grade proximal LAD lesion treated with OCT guided PCI with 1 drug-eluting stent with mild disease elsewhere. 2.  LVEDP of 7 to 9 mmHg.   Recommendation: Dual antiplatelet therapy for 6 months and same-day discharge for PCI with cardiology follow-up.   Findings Coronary Findings Diagnostic  Dominance: Right   Left Anterior Descending Prox LAD lesion is 80% stenosed.   Right Coronary Artery Prox RCA lesion is 10% stenosed.   Intervention   Prox LAD lesion Stent A stent was successfully placed. Post-Intervention Lesion Assessment The intervention was successful. Pre-interventional TIMI flow is 3. Post-intervention TIMI flow is 3. No complications occurred at this lesion. There is a 0% residual stenosis post intervention.     ECHOCARDIOGRAM   ECHOCARDIOGRAM COMPLETE 07/11/2021   Narrative ECHOCARDIOGRAM REPORT       Patient Name:   Vikranth Eddinger Date of Exam: 07/11/2021 Medical Rec #:  [id number]      Height:       71.0 in Accession #:    [id number]     Weight:  217.4 lb Date of Birth:  1955-07-18      BSA:          2.185 m Patient Age:    71 years       BP:           124/70 mmHg Patient Gender: M              HR:           61 bpm. Exam Location:  [address]   Procedure: 2D Echo, 3D Echo, Cardiac Doppler and Color Doppler   Indications:    R06.00 Dyspnea   History:        Patient has no prior history of Echocardiogram examinations. Signs/Symptoms:Dyspnea; Risk Factors:Family History of Coronary Artery Disease and Former Smoker. Obesity.    Sonographer:    Ewing Holiday RDCS Referring Phys: ARUN K THUKKANI   IMPRESSIONS     1. Left ventricular ejection fraction, by estimation, is 65 to 70%. The left ventricle has normal function. The left ventricle has no regional wall motion abnormalities. Left ventricular diastolic parameters were normal. 2. Right ventricular systolic function is normal. The right ventricular size is normal. 3. Left atrial size was severely dilated. 4. Right atrial size was mildly dilated. 5. The mitral valve is normal in structure. Trivial mitral valve regurgitation. No evidence of mitral stenosis. 6. The aortic valve is tricuspid. Aortic valve regurgitation is not visualized. No aortic stenosis is present. Aortic valve area, by VTI measures 2.52 cm. Aortic valve mean gradient measures 7.0 mmHg. Aortic valve Vmax measures 1.86 m/s. 7. The inferior vena cava is normal in size with greater than 50% respiratory variability, suggesting right atrial pressure of 3 mmHg.   FINDINGS Left Ventricle: Left ventricular ejection fraction, by estimation, is 65 to 70%. The left ventricle has normal function. The left ventricle has no regional wall motion abnormalities. The left ventricular internal cavity size was normal in size. There is no left ventricular hypertrophy. Left ventricular diastolic parameters were normal. Normal left ventricular filling pressure.   Right Ventricle: The right ventricular size is normal. No increase in right ventricular wall thickness. Right ventricular systolic function is normal.   Left Atrium: Left atrial size was severely dilated.   Right Atrium: Right atrial size was mildly dilated.   Pericardium: There is no evidence of pericardial effusion.   Mitral Valve: The mitral valve is normal in structure. Trivial mitral valve regurgitation. No evidence of mitral valve stenosis.   Tricuspid Valve: The tricuspid valve is normal in structure. Tricuspid valve regurgitation is not  demonstrated. No evidence of tricuspid stenosis.   Aortic Valve: The aortic valve is tricuspid. Aortic valve regurgitation is not visualized. No aortic stenosis is present. Aortic valve mean gradient measures 7.0 mmHg. Aortic valve peak gradient measures 13.8 mmHg. Aortic valve area, by VTI measures 2.52 cm.   Pulmonic Valve: The pulmonic valve was normal in structure. Pulmonic valve regurgitation is not visualized. No evidence of pulmonic stenosis.   Aorta: The aortic root is normal in size and structure.   Venous: The inferior vena cava is normal in size with greater than 50% respiratory variability, suggesting right atrial pressure of 3 mmHg.   IAS/Shunts: No atrial level shunt detected by color flow Doppler.     LEFT VENTRICLE PLAX 2D LVIDd:         4.65 cm   Diastology LVIDs:         2.80 cm   LV e' medial:    9.57  cm/s LV PW:         0.95 cm   LV E/e' medial:  10.0 LV IVS:        0.80 cm   LV e' lateral:   12.70 cm/s LVOT diam:     2.40 cm   LV E/e' lateral: 7.5 LV SV:         110 LV SV Index:   50 LVOT Area:     4.52 cm   3D Volume EF: 3D EF:        62 % LV EDV:       132 ml LV ESV:       51 ml LV SV:        81 ml   RIGHT VENTRICLE RV Basal diam:  4.60 cm RV Mid diam:    3.80 cm RV S prime:     16.50 cm/s TAPSE (M-mode): 3.1 cm   LEFT ATRIUM              Index        RIGHT ATRIUM           Index LA diam:        4.10 cm  1.88 cm/m   RA Area:     21.50 cm LA Vol (A2C):   50.2 ml  22.98 ml/m  RA Volume:   66.10 ml  30.26 ml/m LA Vol (A4C):   101.0 ml 46.23 ml/m LA Biplane Vol: 72.8 ml  33.32 ml/m AORTIC VALVE AV Area (Vmax):    2.75 cm AV Area (Vmean):   2.76 cm AV Area (VTI):     2.52 cm AV Vmax:           186.00 cm/s AV Vmean:          123.000 cm/s AV VTI:            0.436 m AV Peak Grad:      13.8 mmHg AV Mean Grad:      7.0 mmHg LVOT Vmax:         113.00 cm/s LVOT Vmean:        74.967 cm/s LVOT VTI:          0.243 m LVOT/AV VTI ratio: 0.56    AORTA Ao Root diam: 2.80 cm Ao Asc diam:  3.25 cm   MITRAL VALVE MV Area (PHT): cm         SHUNTS MV Decel Time: 224 msec    Systemic VTI:  0.24 m MV E velocity: 95.40 cm/s  Systemic Diam: 2.40 cm MV A velocity: 81.00 cm/s MV E/A ratio:  1.18   Maudine Sos MD Electronically signed by Maudine Sos MD Signature Date/Time: 07/11/2021/7:27:48 PM       Final     CT SCANS   CT CORONARY MORPH W/CTA COR W/SCORE 07/13/2021   Addendum 07/13/2021 11:40 AM ADDENDUM REPORT: 07/13/2021 11:37   CLINICAL DATA:  Chest pain   EXAM: Cardiac/Coronary CTA   TECHNIQUE: A non-contrast, gated CT scan was obtained with axial slices of 3 mm through the heart for calcium  scoring. Calcium  scoring was performed using the Agatston method. A 120 kV prospective, gated, contrast cardiac scan was obtained. Gantry rotation speed was 250 msecs and collimation was 0.6 mm. Two sublingual nitroglycerin  tablets (0.8 mg) were given. The 3D data set was reconstructed in 5% intervals of the 35-75% of the R-R cycle. Diastolic phases were analyzed on a dedicated workstation using MPR, MIP, and VRT modes. The patient received  95 cc of contrast.   FINDINGS: Image quality: Excellent.   Noise artifact is: Limited.   Coronary Arteries:  Normal coronary origin.  Right dominance.   Left main: The left main is a large caliber vessel with a normal take off from the left coronary cusp that bifurcates to form a left anterior descending artery and a left circumflex artery. There is minimal calcified plaque (<25%).   Left anterior descending artery: The proximal LAD contains a severe mixed density plaque (70-99%). The mid and distal segments contain minimal mixed density plaque (<25%). The LAD gives off 1 patent diagonal branches.   Left circumflex artery: The LCX is non-dominant. The proximal LCX contains minimal calcified plaque (<25%). The mid LCX contains mild non-calcified plaque (25-49%). The distal  segment is patent. The LCX gives off 3 patent obtuse marginal branches.   Right coronary artery: The RCA is dominant with normal take off from the right coronary cusp. There is minimal calcified plaque (<25%) in the proximal and mid segments. The distal RCA is patent. The RCA terminates as a PDA and right posterolateral branch without evidence of plaque or stenosis.   Right Atrium: Right atrial size is within normal limits.   Right Ventricle: The right ventricular cavity is within normal limits.   Left Atrium: Left atrial size is normal in size with no left atrial appendage filling defect.   Left Ventricle: The ventricular cavity size is within normal limits.   Pulmonary arteries: Normal in size without proximal filling defect.   Pulmonary veins: Normal pulmonary venous drainage.   Pericardium: Normal thickness without significant effusion or calcium  present.   Cardiac valves: The aortic valve is trileaflet without significant calcification. The mitral valve is normal without significant calcification.   Aorta: Normal caliber without significant disease.   Extra-cardiac findings: See attached radiology report for non-cardiac structures.   IMPRESSION: 1. Coronary calcium  score of 325. This was 71st percentile for age-, sex, and race-matched controls.   2. Normal coronary origin with right dominance.   3. Severe mixed density plaque (70-99%) in the proximal LAD.   4. Mild non-calcified plaque (25-49%) in the mid LCX.   5. Minimal calcified plaque (<25%) in the RCA.   RECOMMENDATIONS: 1. Severe stenosis in the proximal LAD (70-99%). CT FFR will be submitted. Consider symptom-guided anti-ischemic pharmacotherapy as well as risk factor modification per guideline directed care. Invasive coronary angiography recommended with revascularization per published guideline statements.   Jackquelyn Mass, MD     Electronically Signed By: Jackquelyn Mass M.D. On: 07/13/2021  11:37   Narrative EXAM: OVER-READ INTERPRETATION  CT CHEST   The following report is a limited chest CT over-read performed by radiologist Dr. Alexandria Angel of [facility] on 07/13/2021. The coronary calcium  score and cardiac CTA interpretation by the cardiologist is attached.   COMPARISON:  None Available.   FINDINGS: Atherosclerotic calcifications in the thoracic aorta. Irregular-shaped 8 x 5 mm nodule in the right middle lobe (axial image 24 of series 12), with some adjacent lucency in the lungs suggesting air trapping. Within the visualized portions of the thorax there are no other larger more suspicious appearing pulmonary nodules or masses, there is no acute consolidative airspace disease, no pleural effusions, no pneumothorax and no lymphadenopathy. Visualized portions of the upper abdomen are unremarkable. There are no aggressive appearing lytic or blastic lesions noted in the visualized portions of the skeleton.   IMPRESSION: 1. 8 x 5 mm right middle lobe nodule with some associated air  trapping. This could represent an area of mucoid impaction within some bronchi, however, close attention on follow-up studies is recommended as the possibility of an endobronchial or intraparenchymal nodule is not excluded. Non-contrast chest CT at 6-12 months is recommended. If the nodule is stable at time of repeat CT, then future CT at 18-24 months (from today's scan) is considered optional for low-risk patients, but is recommended for high-risk patients. This recommendation follows the consensus statement: Guidelines for Management of Incidental Pulmonary Nodules Detected on CT Images: From the Fleischner Society 2017; Radiology 2017; 284:228-243. 2.  Aortic Atherosclerosis (ICD10-I70.0).   Electronically Signed: By: Alexandria Angel M.D. On: 07/13/2021 09:57        Physical Exam:   VS:  BP (!) 154/70   Pulse (!) 58   Ht 5\' 11"  (1.803 m)   Wt 214 lb 3.2 oz  (97.2 kg)   SpO2 95%   BMI 29.87 kg/m    Wt Readings from Last 3 Encounters:  03/25/23 214 lb 3.2 oz (97.2 kg)  10/22/22 224 lb (101.6 kg)  09/26/22 227 lb (103 kg)    GEN: Well nourished, well developed in no acute distress NECK: No JVD; No carotid bruits CARDIAC: RRR, no murmurs, rubs, gallops RESPIRATORY:  Clear to auscultation without rales, wheezing or rhonchi  ABDOMEN: Soft, non-tender, non-distended EXTREMITIES:  No edema; No deformity   ASSESSMENT AND PLAN: .   Hypertension Elevated blood pressure in clinic today, but patient reports better control at home. Currently on Losartan  25mg . -Continue current regimen. -Advise patient to monitor blood pressure at home and report if consistently above 150 systolic.  Hyperlipidemia LDL and triglycerides well controlled on current regimen. -Continue current regimen.  Type 2 Diabetes Mellitus A1c improved from 7.5 to 6.5 with dietary changes. -Continue dietary modifications. -Check A1c today.  Asthma Chronic condition, currently managed with Symbicort and Albuterol  as needed. -Continue current regimen.  Coronary Artery Disease On Plavix  for monotherapy. -Continue Plavix .  General Health Maintenance -Continue lifestyle modifications including abstaining from alcohol and smoking. -Continue monitoring blood pressure at home.      Dispo: He can follow-up in 6 months with Dr. Lorie Rook  Signed, Von Grumbling, PA-C

## 2023-03-25 ENCOUNTER — Encounter: Payer: Self-pay | Admitting: Physician Assistant

## 2023-03-25 ENCOUNTER — Ambulatory Visit: Payer: Medicare HMO | Attending: Physician Assistant | Admitting: Physician Assistant

## 2023-03-25 VITALS — BP 154/70 | HR 58 | Ht 71.0 in | Wt 214.2 lb

## 2023-03-25 DIAGNOSIS — I700 Atherosclerosis of aorta: Secondary | ICD-10-CM

## 2023-03-25 DIAGNOSIS — Z6829 Body mass index (BMI) 29.0-29.9, adult: Secondary | ICD-10-CM

## 2023-03-25 DIAGNOSIS — E785 Hyperlipidemia, unspecified: Secondary | ICD-10-CM

## 2023-03-25 DIAGNOSIS — I2510 Atherosclerotic heart disease of native coronary artery without angina pectoris: Secondary | ICD-10-CM

## 2023-03-25 DIAGNOSIS — E119 Type 2 diabetes mellitus without complications: Secondary | ICD-10-CM

## 2023-03-25 NOTE — Patient Instructions (Signed)
 Medication Instructions:   Your physician recommends that you continue on your current medications as directed. Please refer to the Current Medication list given to you today.   *If you need a refill on your cardiac medications before your next appointment, please call your pharmacy*   Lab Work:   PLEASE GO DOWN STAIRS  [facility]  FIRST FLOOR  [address] ( GET OFF ELEVATORS MAKE A LEFT AND ANOTHER LEFT LAB ON RIGHT DOWN HALLWAY :  A1C     If you have labs (blood work) drawn today and your tests are completely normal, you will receive your results only by: MyChart Message (if you have MyChart) OR A paper copy in the mail If you have any lab test that is abnormal or we need to change your treatment, we will call you to review the results.   Testing/Procedures: NONE ORDERED  TODAY     Follow-Up: At [facility], you and your health needs are our priority.  As part of our continuing mission to provide you with exceptional heart care, we have created designated Provider Care Teams.  These Care Teams include your primary Cardiologist (physician) and Advanced Practice Providers (APPs -  Physician Assistants and Nurse Practitioners) who all work together to provide you with the care you need, when you need it.  We recommend signing up for the patient portal called "MyChart".  Sign up information is provided on this After Visit Summary.  MyChart is used to connect with patients for Virtual Visits (Telemedicine).  Patients are able to view lab/test results, encounter notes, upcoming appointments, etc.  Non-urgent messages can be sent to your provider as well.   To learn more about what you can do with MyChart, go to [email or web address].    Your next appointment:   6 month(s)  Provider:   Arun K Thukkani, MD     Other Instructions

## 2023-03-26 LAB — HEMOGLOBIN A1C
Est. average glucose Bld gHb Est-mCnc: 154 mg/dL
Hgb A1c MFr Bld: 7 % — ABNORMAL HIGH (ref 4.8–5.6)

## 2023-03-27 ENCOUNTER — Encounter: Payer: Self-pay | Admitting: Physician Assistant

## 2023-04-05 DIAGNOSIS — L720 Epidermal cyst: Secondary | ICD-10-CM | POA: Diagnosis not present

## 2023-04-05 DIAGNOSIS — L905 Scar conditions and fibrosis of skin: Secondary | ICD-10-CM | POA: Diagnosis not present

## 2023-06-02 ENCOUNTER — Other Ambulatory Visit: Payer: Self-pay | Admitting: Internal Medicine

## 2023-07-13 DIAGNOSIS — I2510 Atherosclerotic heart disease of native coronary artery without angina pectoris: Secondary | ICD-10-CM | POA: Diagnosis not present

## 2023-07-13 DIAGNOSIS — J45909 Unspecified asthma, uncomplicated: Secondary | ICD-10-CM | POA: Diagnosis not present

## 2023-07-13 DIAGNOSIS — E785 Hyperlipidemia, unspecified: Secondary | ICD-10-CM | POA: Diagnosis not present

## 2023-07-13 DIAGNOSIS — E1165 Type 2 diabetes mellitus with hyperglycemia: Secondary | ICD-10-CM | POA: Diagnosis not present

## 2023-08-12 DIAGNOSIS — J45909 Unspecified asthma, uncomplicated: Secondary | ICD-10-CM | POA: Diagnosis not present

## 2023-08-12 DIAGNOSIS — E785 Hyperlipidemia, unspecified: Secondary | ICD-10-CM | POA: Diagnosis not present

## 2023-08-12 DIAGNOSIS — I2510 Atherosclerotic heart disease of native coronary artery without angina pectoris: Secondary | ICD-10-CM | POA: Diagnosis not present

## 2023-08-12 DIAGNOSIS — E1165 Type 2 diabetes mellitus with hyperglycemia: Secondary | ICD-10-CM | POA: Diagnosis not present

## 2023-09-11 ENCOUNTER — Other Ambulatory Visit: Payer: Self-pay | Admitting: Internal Medicine

## 2023-09-12 DIAGNOSIS — E1165 Type 2 diabetes mellitus with hyperglycemia: Secondary | ICD-10-CM | POA: Diagnosis not present

## 2023-09-12 DIAGNOSIS — J45909 Unspecified asthma, uncomplicated: Secondary | ICD-10-CM | POA: Diagnosis not present

## 2023-09-12 DIAGNOSIS — E785 Hyperlipidemia, unspecified: Secondary | ICD-10-CM | POA: Diagnosis not present

## 2023-09-12 DIAGNOSIS — I2510 Atherosclerotic heart disease of native coronary artery without angina pectoris: Secondary | ICD-10-CM | POA: Diagnosis not present

## 2023-10-13 DIAGNOSIS — I2510 Atherosclerotic heart disease of native coronary artery without angina pectoris: Secondary | ICD-10-CM | POA: Diagnosis not present

## 2023-10-13 DIAGNOSIS — E1165 Type 2 diabetes mellitus with hyperglycemia: Secondary | ICD-10-CM | POA: Diagnosis not present

## 2023-10-13 DIAGNOSIS — E785 Hyperlipidemia, unspecified: Secondary | ICD-10-CM | POA: Diagnosis not present

## 2023-10-13 DIAGNOSIS — J45909 Unspecified asthma, uncomplicated: Secondary | ICD-10-CM | POA: Diagnosis not present

## 2023-11-08 NOTE — Progress Notes (Signed)
 Cardiology Office Note:   Date:  11/13/2023  ID:  Travis Simmons, DOB [birth year], MRN [id number] PCP:  [name], MD  [facility] Providers Cardiologist:  Wendel Haws, MD Referring MD: [name], MD  Chief Complaint/Reason for Referral: Follow-up CAD ASSESSMENT:    1. Coronary artery disease involving native coronary artery of native heart without angina pectoris   2. Type 2 diabetes mellitus without complication, without long-term current use of insulin (HCC)   3. Hyperlipidemia associated with type 2 diabetes mellitus (HCC)   4. Hypertension associated with diabetes (HCC)   5. Aortic atherosclerosis   6. BMI 29.0-29.9,adult     PLAN:   In order of problems listed above: CAD: Continue indefinite Plavix  75 mg, atorvastatin  40 mg, as needed nitroglycerin .  Check hs-CRP for inflammatory risk T2DM: Hemoglobin A1c was 7 recently consistent with type 2 diabetes.  Continue Plavix  75, atorvastatin  40, losartan  25, consider Jardiance in the future.  Patient defers right now. Hyperlipidemia: Check lipids and LFTs today.  Continue atorvastatin  40 Hypertension: Increase losartan  to 50 mg Aortic atherosclerosis: Continue Plavix  75, atorvastatin  40 Elevated BMI: Patient has met with pharmacy in 2024 and had deferred GLP-1 receptor agonist in favor of diet and exercise modification.  He has lost a good deal of weight on his own.            Dispo:  Return in about 1 year (around 11/12/2024).       I spent 35 minutes reviewing all clinical data during and prior to this visit including all relevant imaging studies, laboratories, clinical information from other health systems and prior notes from both Cardiology and other specialties, interviewing the patient, conducting a complete physical examination, and coordinating care in order to formulate a comprehensive and personalized evaluation and treatment plan.   History of Present Illness:    FOCUSED PROBLEM LIST:    CAD Angina >> PCI pLAD June 2023 Hyperlipidemia Less than 8.4 Aortic atherosclerosis Coronary CTA 2023 Asthma GERD T2DM  Diet controlled BMI 10 Jul 2021: Patient seen for initial consultation regarding chest pain.  He was referred for coronary CTA which demonstrated high-grade proximal LAD stenosis.  He was started on 2 antianginal agents.  He underwent cardiac catheterization and PCI which was uncomplicated in early June and was discharged on dual antiplatelet therapy with Plavix .  He was seen in follow-up last month and was doing quite well.   July 2023:The patient is doing very well.  He recently completed a trip to know his [place] in [place] .  This consisted of a lot of walking.  He did not develop angina, exertional dyspnea, presyncope, or syncope.  He has had no severe bleeding or bruising while on dual antiplatelet therapy.  He feels very well and is without significant complaints today.  Plan: Continue aspirin  until January 2024 and then continue Plavix  monotherapy indefinitely.  October 2025:  Patient consents to use of AI scribe. The patient returns for routine follow-up.  He was last seen in February of this year.  His hemoglobin A1c was noted to be 7.5.  He was altering his diet in hopes of improving this.  He had no complaints at that visit.  Of note his blood pressure was elevated.  No changes are made to his medical regimen.  Hemoglobin A1c was checked 7.  He is referred to pharmacy for recommendations regarding GLP-1 receptor agonist therapy.  He met with them last September with an improvement in his hemoglobin A1c to 6.7 he  elected not to pursue GLP-1 receptor agonist therapy.  He monitors his blood pressure regularly at home, with readings ranging from 128 to 135 mmHg. He is currently on losartan  25 mg daily. No chest pain or breathing difficulties are reported.  He mentions being asthmatic but reports no current respiratory symptoms. He checks his blood pressure every three  to four days and notes it has been slightly high at home. He drank two cups of coffee on the morning of the visit, which may have affected his blood pressure reading.  He is not currently in need of any medication refills.    Current Medications: Current Meds  Medication Sig   albuterol  (PROVENTIL  HFA;VENTOLIN  HFA) 108 (90 Base) MCG/ACT inhaler Inhale 2 puffs into the lungs every 4 (four) hours as needed for wheezing or shortness of breath.   atorvastatin  (LIPITOR) 40 MG tablet TAKE 1 TABLET BY MOUTH EVERY DAY   budesonide-formoterol  (SYMBICORT) 160-4.5 MCG/ACT inhaler Inhale 2 puffs into the lungs 2 (two) times daily.   clopidogrel  (PLAVIX ) 75 MG tablet TAKE 1 TABLET BY MOUTH EVERY DAY   losartan  (COZAAR ) 50 MG tablet Take 1 tablet (50 mg total) by mouth daily.   meloxicam  (MOBIC ) 15 MG tablet Take 15 mg by mouth daily.   Multiple Vitamins-Minerals (MENS 50+ ADVANCED) CAPS Take 1 capsule by mouth daily at 6 (six) AM.   nitroGLYCERIN  (NITROSTAT ) 0.4 MG SL tablet PLACE 1 TABLET (0.4 MG TOTAL) UNDER THE TONGUE EVERY 5 (FIVE) MINUTES AS NEEDED FOR CHEST PAIN. X 3 DOSES PER CHEST PAIN EPISODE. CALL 911 IF THIRD DOSE IS TAKEN.   [DISCONTINUED] losartan  (COZAAR ) 25 MG tablet TAKE 1 TABLET (25 MG TOTAL) BY MOUTH DAILY.     Review of Systems:   Please see the history of present illness.    All other systems reviewed and are negative.     EKGs/Labs/Other Test Reviewed:   EKG:   EKG Interpretation Date/Time:  Wednesday November 13 2023 08:01:51 EDT Ventricular Rate:  51 PR Interval:  142 QRS Duration:  86 QT Interval:  414 QTC Calculation: 381 R Axis:   14  Text Interpretation: Sinus bradycardia with Premature supraventricular complexes When compared with ECG of 26-Sep-2022 08:50, Premature supraventricular complexes are now Present Confirmed by Wendel Haws ([id number]) on 11/13/2023 8:03:49 AM        CARDIAC STUDIES: Refer to CV Procedures and Imaging Tabs   Risk  Assessment/Calculations:          Physical Exam:   VS:  BP (!) 140/80   Pulse (!) 52   Ht 5' 11 (1.803 m)   Wt 201 lb 9.6 oz (91.4 kg)   SpO2 97%   BMI 28.12 kg/m    HYPERTENSION CONTROL Vitals:   11/13/23 0755 11/13/23 0821  BP: (!) 158/72 (!) 140/80    The patient's blood pressure is elevated above target today.  In order to address the patient's elevated BP: A current anti-hypertensive medication was adjusted today.      Wt Readings from Last 3 Encounters:  11/13/23 201 lb 9.6 oz (91.4 kg)  03/25/23 214 lb 3.2 oz (97.2 kg)  10/22/22 224 lb (101.6 kg)      GENERAL:  No apparent distress, AOx3 HEENT:  No carotid bruits, +2 carotid impulses, no scleral icterus CAR: RRR no murmurs, gallops, rubs, or thrills RES:  Clear to auscultation bilaterally ABD:  Soft, nontender, nondistended, positive bowel sounds x 4 VASC:  +2 radial pulses, +2 carotid pulses NEURO:  CN 2-12  grossly intact; motor and sensory grossly intact PSYCH:  No active depression or anxiety EXT:  No edema, ecchymosis, or cyanosis  Signed, Jesica Goheen K Kalem Rockwell, MD  11/13/2023 8:28 AM    [facility] [address] [place], [place], [place]  [address] Phone: [phone]; Fax: [phone]   Note:  This document was prepared using Dragon voice recognition software and may include unintentional dictation errors.

## 2023-11-11 DIAGNOSIS — Z1212 Encounter for screening for malignant neoplasm of rectum: Secondary | ICD-10-CM | POA: Diagnosis not present

## 2023-11-11 DIAGNOSIS — Z1211 Encounter for screening for malignant neoplasm of colon: Secondary | ICD-10-CM | POA: Diagnosis not present

## 2023-11-12 DIAGNOSIS — E785 Hyperlipidemia, unspecified: Secondary | ICD-10-CM | POA: Diagnosis not present

## 2023-11-12 DIAGNOSIS — I2510 Atherosclerotic heart disease of native coronary artery without angina pectoris: Secondary | ICD-10-CM | POA: Diagnosis not present

## 2023-11-12 DIAGNOSIS — E1165 Type 2 diabetes mellitus with hyperglycemia: Secondary | ICD-10-CM | POA: Diagnosis not present

## 2023-11-12 DIAGNOSIS — J45909 Unspecified asthma, uncomplicated: Secondary | ICD-10-CM | POA: Diagnosis not present

## 2023-11-13 ENCOUNTER — Ambulatory Visit: Attending: Internal Medicine | Admitting: Internal Medicine

## 2023-11-13 ENCOUNTER — Encounter: Payer: Self-pay | Admitting: Internal Medicine

## 2023-11-13 VITALS — BP 140/80 | HR 52 | Ht 71.0 in | Wt 201.6 lb

## 2023-11-13 DIAGNOSIS — E119 Type 2 diabetes mellitus without complications: Secondary | ICD-10-CM

## 2023-11-13 DIAGNOSIS — E1169 Type 2 diabetes mellitus with other specified complication: Secondary | ICD-10-CM

## 2023-11-13 DIAGNOSIS — I2510 Atherosclerotic heart disease of native coronary artery without angina pectoris: Secondary | ICD-10-CM | POA: Diagnosis not present

## 2023-11-13 DIAGNOSIS — I700 Atherosclerosis of aorta: Secondary | ICD-10-CM | POA: Diagnosis not present

## 2023-11-13 DIAGNOSIS — E1159 Type 2 diabetes mellitus with other circulatory complications: Secondary | ICD-10-CM

## 2023-11-13 DIAGNOSIS — Z6829 Body mass index (BMI) 29.0-29.9, adult: Secondary | ICD-10-CM | POA: Diagnosis not present

## 2023-11-13 DIAGNOSIS — I152 Hypertension secondary to endocrine disorders: Secondary | ICD-10-CM | POA: Diagnosis not present

## 2023-11-13 DIAGNOSIS — E785 Hyperlipidemia, unspecified: Secondary | ICD-10-CM

## 2023-11-13 MED ORDER — LOSARTAN POTASSIUM 50 MG PO TABS: 50.0000 mg | ORAL_TABLET | Freq: Every day | ORAL | 3 refills | Status: AC

## 2023-11-13 NOTE — Patient Instructions (Addendum)
 Medication Instructions:   Increase Losartan   to 50 mg  *If you need a refill on your cardiac medications before your next appointment, please call your pharmacy*   Lab Work:today LIPID LIVER PROFILE C-RP  If you have labs (blood work) drawn today and your tests are completely normal, you will receive your results only by: MyChart Message (if you have MyChart) OR A paper copy in the mail If you have any lab test that is abnormal or we need to change your treatment, we will call you to review the results.   Testing/Procedures:   Not needed Follow-Up: At [facility], you and your health needs are our priority.  As part of our continuing mission to provide you with exceptional heart care, we have created designated Provider Care Teams.  These Care Teams include your primary Cardiologist (physician) and Advanced Practice Providers (APPs -  Physician Assistants and Nurse Practitioners) who all work together to provide you with the care you need, when you need it.     Your next appointment:   12 month(s)  The format for your next appointment:   In Person  Provider:   Glendia Ferrier, PA-C

## 2023-11-14 ENCOUNTER — Ambulatory Visit: Payer: Self-pay | Admitting: Internal Medicine

## 2023-11-14 LAB — LIPID PANEL
Chol/HDL Ratio: 2.6 ratio (ref 0.0–5.0)
Cholesterol, Total: 139 mg/dL (ref 100–199)
HDL: 54 mg/dL (ref >39)
LDL Chol Calc (NIH): 70 mg/dL (ref 0–99)
Triglycerides: 75 mg/dL (ref 0–149)
VLDL Cholesterol Cal: 15 mg/dL (ref 5–40)

## 2023-11-14 LAB — HEPATIC FUNCTION PANEL
ALT: 29 IU/L (ref 0–44)
AST: 19 IU/L (ref 0–40)
Albumin: 4.7 g/dL (ref 3.9–4.9)
Alkaline Phosphatase: 79 IU/L (ref 47–123)
Bilirubin Total: 1.7 mg/dL — ABNORMAL HIGH (ref 0.0–1.2)
Bilirubin, Direct: 0.43 mg/dL — ABNORMAL HIGH (ref 0.00–0.40)
Total Protein: 6.8 g/dL (ref 6.0–8.5)

## 2023-11-14 LAB — COLOGUARD: COLOGUARD: POSITIVE — AB

## 2023-11-14 LAB — HIGH SENSITIVITY CRP: CRP, High Sensitivity: 0.75 mg/L (ref 0.00–3.00)

## 2023-12-05 ENCOUNTER — Telehealth (HOSPITAL_BASED_OUTPATIENT_CLINIC_OR_DEPARTMENT_OTHER): Payer: Self-pay

## 2023-12-05 DIAGNOSIS — K921 Melena: Secondary | ICD-10-CM | POA: Diagnosis not present

## 2023-12-05 DIAGNOSIS — Z7902 Long term (current) use of antithrombotics/antiplatelets: Secondary | ICD-10-CM | POA: Diagnosis not present

## 2023-12-05 DIAGNOSIS — R195 Other fecal abnormalities: Secondary | ICD-10-CM | POA: Diagnosis not present

## 2023-12-05 DIAGNOSIS — I2510 Atherosclerotic heart disease of native coronary artery without angina pectoris: Secondary | ICD-10-CM | POA: Diagnosis not present

## 2023-12-05 NOTE — Telephone Encounter (Signed)
   Pre-operative Risk Assessment    Patient Name: Travis Simmons  DOB: Nov 08, 1955 MRN: [id number]   Date of last office visit: 11/14/23 with Dr. Wendel Date of next office visit: NA   Request for Surgical Clearance    Procedure:  Colonoscopy   Date of Surgery:  Clearance 12/28/23                                Surgeon:  . Dr. Kriss  Surgeon's Group or Practice Name:  [facility] Phone number:  [phone] Fax number:  [phone]   Type of Clearance Requested:   - Medical  - Pharmacy:  Hold Clopidogrel  (Plavix ) 5 days prior to procedure   Type of Anesthesia:  propofol    Additional requests/questions:    Bonney Augustin JONETTA Delores   12/05/2023, 12:41 PM

## 2023-12-06 NOTE — Telephone Encounter (Signed)
   Patient Name: Travis Simmons  DOB: September 12, 1955 MRN: [id number]  Primary Cardiologist: Arun K Thukkani, MD  Chart reviewed as part of pre-operative protocol coverage. Given past medical history and time since last visit, based on ACC/AHA guidelines, Desman Polak is at acceptable risk for the planned procedure without further cardiovascular testing.   Per Dr.Thukkani He is at low risk for endoscopy. Change plavix  to asa, hold plavix  x 5 days, stay on asa during procedure, stop asa and restart plavix  after procedure   The patient was advised that if he develops new symptoms prior to surgery to contact our office to arrange for a follow-up visit, and he verbalized understanding.  I will route this recommendation to the requesting party via Epic fax function and remove from pre-op pool.  Please call with questions.  Lamarr Satterfield, NP 12/06/2023, 7:34 AM

## 2023-12-09 ENCOUNTER — Other Ambulatory Visit: Payer: Self-pay

## 2023-12-10 MED ORDER — CLOPIDOGREL BISULFATE 75 MG PO TABS: 75.0000 mg | ORAL_TABLET | Freq: Every day | ORAL | 3 refills | Status: AC

## 2023-12-13 DIAGNOSIS — J45909 Unspecified asthma, uncomplicated: Secondary | ICD-10-CM | POA: Diagnosis not present

## 2023-12-13 DIAGNOSIS — E1165 Type 2 diabetes mellitus with hyperglycemia: Secondary | ICD-10-CM | POA: Diagnosis not present

## 2023-12-13 DIAGNOSIS — I2510 Atherosclerotic heart disease of native coronary artery without angina pectoris: Secondary | ICD-10-CM | POA: Diagnosis not present

## 2023-12-13 DIAGNOSIS — E785 Hyperlipidemia, unspecified: Secondary | ICD-10-CM | POA: Diagnosis not present

## 2024-01-07 DIAGNOSIS — R195 Other fecal abnormalities: Secondary | ICD-10-CM | POA: Diagnosis not present

## 2024-01-07 DIAGNOSIS — D125 Benign neoplasm of sigmoid colon: Secondary | ICD-10-CM | POA: Diagnosis not present

## 2024-01-07 DIAGNOSIS — K3189 Other diseases of stomach and duodenum: Secondary | ICD-10-CM | POA: Diagnosis not present

## 2024-01-07 DIAGNOSIS — K644 Residual hemorrhoidal skin tags: Secondary | ICD-10-CM | POA: Diagnosis not present

## 2024-01-07 DIAGNOSIS — K5730 Diverticulosis of large intestine without perforation or abscess without bleeding: Secondary | ICD-10-CM | POA: Diagnosis not present

## 2024-01-07 DIAGNOSIS — Z1211 Encounter for screening for malignant neoplasm of colon: Secondary | ICD-10-CM | POA: Diagnosis not present

## 2024-01-07 DIAGNOSIS — K2970 Gastritis, unspecified, without bleeding: Secondary | ICD-10-CM | POA: Diagnosis not present

## 2024-01-07 DIAGNOSIS — K921 Melena: Secondary | ICD-10-CM | POA: Diagnosis not present

## 2024-01-12 DIAGNOSIS — I2510 Atherosclerotic heart disease of native coronary artery without angina pectoris: Secondary | ICD-10-CM | POA: Diagnosis not present

## 2024-01-12 DIAGNOSIS — J45909 Unspecified asthma, uncomplicated: Secondary | ICD-10-CM | POA: Diagnosis not present

## 2024-01-12 DIAGNOSIS — E1165 Type 2 diabetes mellitus with hyperglycemia: Secondary | ICD-10-CM | POA: Diagnosis not present

## 2024-01-12 DIAGNOSIS — E785 Hyperlipidemia, unspecified: Secondary | ICD-10-CM | POA: Diagnosis not present

## 2024-01-13 DIAGNOSIS — K3189 Other diseases of stomach and duodenum: Secondary | ICD-10-CM | POA: Diagnosis not present

## 2024-01-13 DIAGNOSIS — D125 Benign neoplasm of sigmoid colon: Secondary | ICD-10-CM | POA: Diagnosis not present

## 2024-02-18 ENCOUNTER — Other Ambulatory Visit: Payer: Self-pay | Admitting: Internal Medicine

## 2024-02-18 MED ORDER — NITROGLYCERIN 0.4 MG SL SUBL: 0.4000 mg | SUBLINGUAL_TABLET | SUBLINGUAL | 3 refills | Status: AC | PRN
